# Patient Record
Sex: Female | Born: 1965
Health system: Southern US, Community
[De-identification: ages and names within clinical notes are randomized; demographics above are authoritative.]

## PROBLEM LIST (undated history)

## (undated) DIAGNOSIS — K649 Unspecified hemorrhoids: Secondary | ICD-10-CM

## (undated) HISTORY — DX: Unspecified hemorrhoids: K64.9

---

## 1998-06-27 ENCOUNTER — Inpatient Hospital Stay (HOSPITAL_COMMUNITY): Admission: AD | Admit: 1998-06-27 | Discharge: 1998-06-27 | Payer: Self-pay | Admitting: Obstetrics & Gynecology

## 2000-08-04 ENCOUNTER — Other Ambulatory Visit: Admission: RE | Admit: 2000-08-04 | Discharge: 2000-08-04 | Payer: Self-pay | Admitting: Obstetrics and Gynecology

## 2000-08-04 ENCOUNTER — Other Ambulatory Visit: Admission: RE | Admit: 2000-08-04 | Discharge: 2000-08-04 | Payer: Self-pay | Admitting: Gynecology

## 2001-03-15 ENCOUNTER — Inpatient Hospital Stay (HOSPITAL_COMMUNITY): Admission: AD | Admit: 2001-03-15 | Discharge: 2001-03-18 | Payer: Self-pay | Admitting: Obstetrics & Gynecology

## 2001-07-08 ENCOUNTER — Other Ambulatory Visit: Admission: RE | Admit: 2001-07-08 | Discharge: 2001-07-08 | Payer: Self-pay | Admitting: Obstetrics and Gynecology

## 2002-09-25 ENCOUNTER — Other Ambulatory Visit: Admission: RE | Admit: 2002-09-25 | Discharge: 2002-09-25 | Payer: Self-pay | Admitting: Obstetrics and Gynecology

## 2004-05-09 ENCOUNTER — Other Ambulatory Visit: Admission: RE | Admit: 2004-05-09 | Discharge: 2004-05-09 | Payer: Self-pay | Admitting: Obstetrics and Gynecology

## 2004-11-13 ENCOUNTER — Ambulatory Visit: Payer: Self-pay | Admitting: Family Medicine

## 2005-01-01 ENCOUNTER — Ambulatory Visit: Payer: Self-pay | Admitting: Family Medicine

## 2006-11-19 ENCOUNTER — Emergency Department (HOSPITAL_COMMUNITY): Admission: EM | Admit: 2006-11-19 | Discharge: 2006-11-20 | Payer: Self-pay | Admitting: Emergency Medicine

## 2007-02-16 ENCOUNTER — Ambulatory Visit: Payer: Self-pay | Admitting: Family Medicine

## 2007-02-17 ENCOUNTER — Ambulatory Visit: Payer: Self-pay | Admitting: Family Medicine

## 2007-02-17 LAB — CONVERTED CEMR LAB
ALT: 16 units/L (ref 0–40)
AST: 23 units/L (ref 0–37)
BUN: 8 mg/dL (ref 6–23)
Calcium: 9.3 mg/dL (ref 8.4–10.5)
Chloride: 105 meq/L (ref 96–112)
Creatinine, Ser: 0.8 mg/dL (ref 0.4–1.2)
Eosinophils Absolute: 0 10*3/uL (ref 0.0–0.6)
Eosinophils Relative: 0.1 % (ref 0.0–5.0)
GFR calc Af Amer: 103 mL/min
HCT: 37.2 % (ref 36.0–46.0)
Hemoglobin: 13 g/dL (ref 12.0–15.0)
Monocytes Absolute: 0 10*3/uL — ABNORMAL LOW (ref 0.2–0.7)
Monocytes Relative: 0.7 % — ABNORMAL LOW (ref 3.0–11.0)
Neutro Abs: 5.2 10*3/uL (ref 1.4–7.7)
Neutrophils Relative %: 87.1 % — ABNORMAL HIGH (ref 43.0–77.0)
RDW: 12.9 % (ref 11.5–14.6)
Total Bilirubin: 0.9 mg/dL (ref 0.3–1.2)
Total Protein: 6.6 g/dL (ref 6.0–8.3)
WBC: 5.9 10*3/uL (ref 4.5–10.5)

## 2007-03-10 ENCOUNTER — Ambulatory Visit: Payer: Self-pay | Admitting: Family Medicine

## 2007-03-10 LAB — CONVERTED CEMR LAB
BUN: 11 mg/dL (ref 6–23)
Basophils Absolute: 0.1 10*3/uL (ref 0.0–0.1)
Basophils Relative: 0.9 % (ref 0.0–1.0)
Creatinine, Ser: 0.8 mg/dL (ref 0.4–1.2)
Eosinophils Absolute: 0.1 10*3/uL (ref 0.0–0.6)
GFR calc Af Amer: 103 mL/min
GFR calc non Af Amer: 85 mL/min
HCT: 38.4 % (ref 36.0–46.0)
Hgb A1c MFr Bld: 4.3 % — ABNORMAL LOW (ref 4.6–6.0)
Monocytes Absolute: 0.5 10*3/uL (ref 0.2–0.7)
Monocytes Relative: 6.2 % (ref 3.0–11.0)
Platelets: 250 10*3/uL (ref 150–400)
RBC: 4.73 M/uL (ref 3.87–5.11)
RDW: 12.8 % (ref 11.5–14.6)

## 2007-05-07 ENCOUNTER — Encounter: Payer: Self-pay | Admitting: Family Medicine

## 2007-05-09 ENCOUNTER — Ambulatory Visit: Payer: Self-pay | Admitting: Family Medicine

## 2007-05-09 DIAGNOSIS — L259 Unspecified contact dermatitis, unspecified cause: Secondary | ICD-10-CM | POA: Insufficient documentation

## 2007-05-09 LAB — CONVERTED CEMR LAB
Basophils Absolute: 0.1 10*3/uL (ref 0.0–0.1)
CO2: 31 meq/L (ref 19–32)
Chloride: 106 meq/L (ref 96–112)
Creatinine, Ser: 0.7 mg/dL (ref 0.4–1.2)
Eosinophils Absolute: 0.1 10*3/uL (ref 0.0–0.6)
GFR calc Af Amer: 120 mL/min
LDL Cholesterol: 112 mg/dL — ABNORMAL HIGH (ref 0–99)
Lymphocytes Relative: 38.7 % (ref 12.0–46.0)
Monocytes Absolute: 0.3 10*3/uL (ref 0.2–0.7)
Monocytes Relative: 5.3 % (ref 3.0–11.0)
Neutro Abs: 3.2 10*3/uL (ref 1.4–7.7)
Potassium: 4.3 meq/L (ref 3.5–5.1)
Triglycerides: 78 mg/dL (ref 0–149)
VLDL: 16 mg/dL (ref 0–40)
WBC: 6 10*3/uL (ref 4.5–10.5)

## 2008-01-18 IMAGING — CT CT ANGIO CHEST
2 of 5 series · 18 of 36 positions shown · IV contrast (omnipaque)
Comparison: No prior CT. Two-view chest x-ray earlier in the morning is
correlated.

CLINICAL DATA: Left-sided chest pain and shoulder pain. Shortness of breath.

CT ANGIOGRAPHY OF CHEST ( PULMONARY EMBOLISM PROTOCOL)  11/20/2006:
TECHNIQUE: Multidetector CT imaging of the chest was performed according to the
protocol for detection of pulmonary embolism during bolus injection of
intravenous contrast.  Coronal and sagittal plane CT angiographic image
reconstructions were also generated.
Contrast:  80 cc Omnipaque 300

[Series 5: pe 1.0 b20f st · axial · 0.53mm/px · z∈[-255,-33]mm · 15 of 254 slices shown]
[im 16/254  lung]
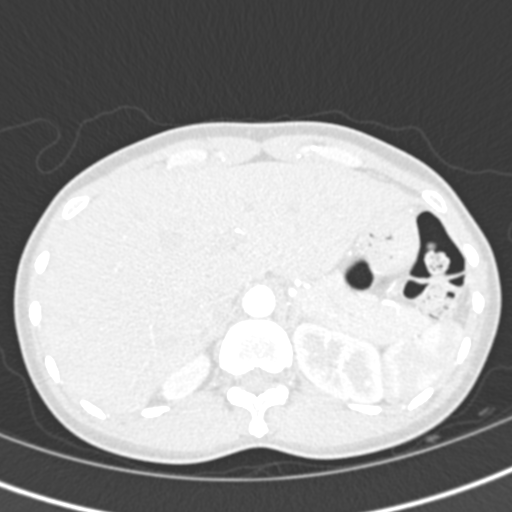
[im 32/254  mediastinal]
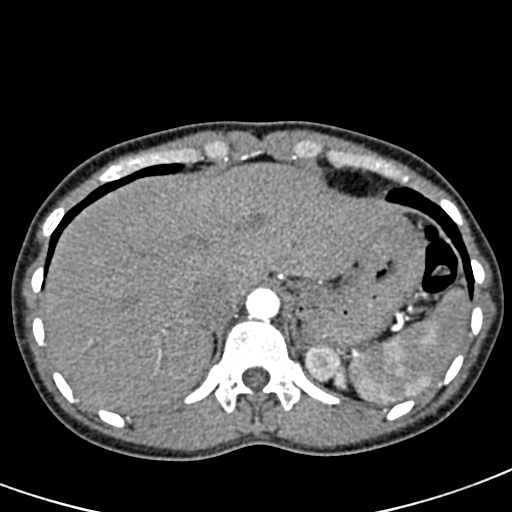
[im 48/254  lung]
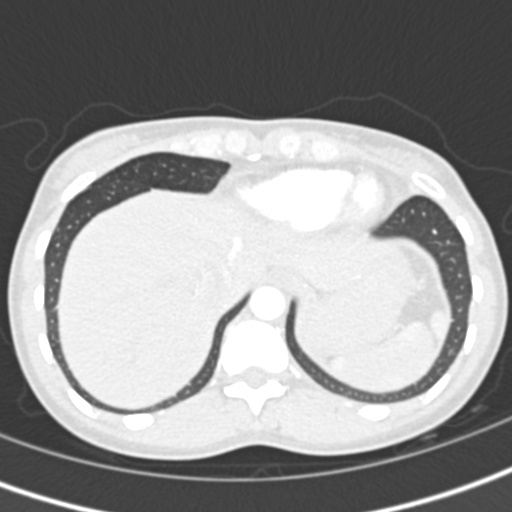
[im 64/254  mediastinal]
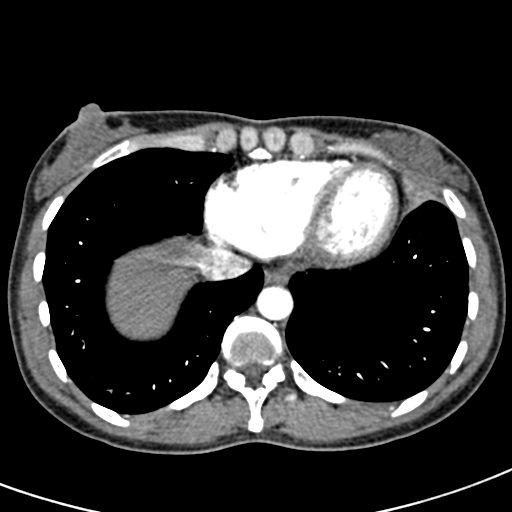
[im 80/254  lung]
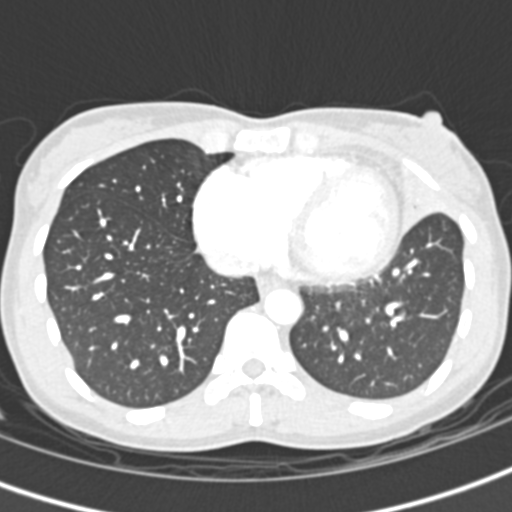
[im 95/254  mediastinal]
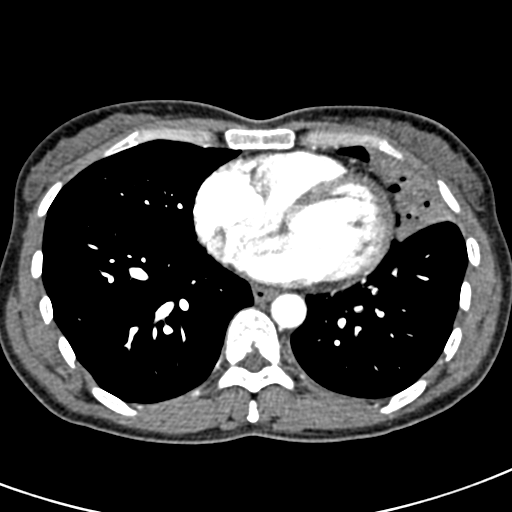
[im 111/254  lung]
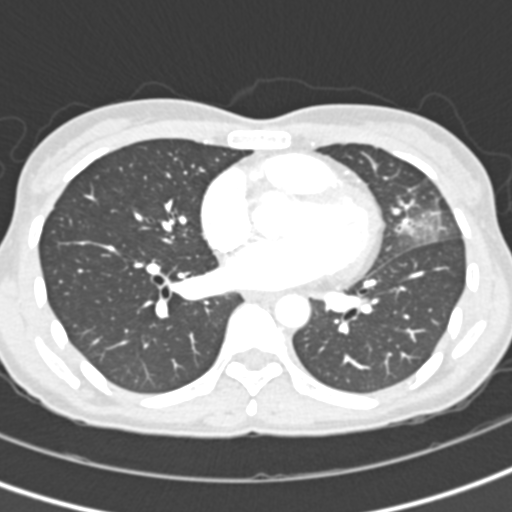
[im 127/254  mediastinal]
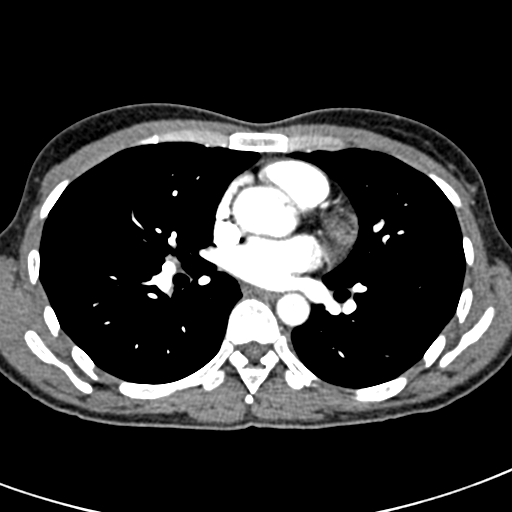
[im 143/254  lung]
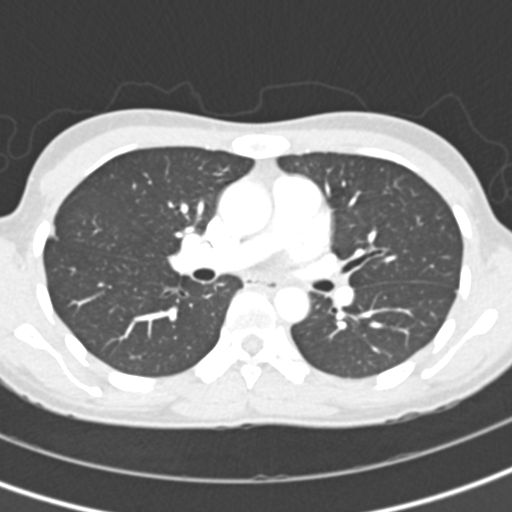
[im 159/254  mediastinal]
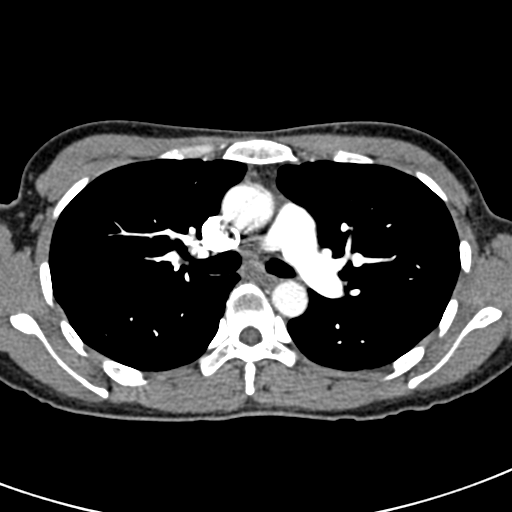
[im 174/254  lung]
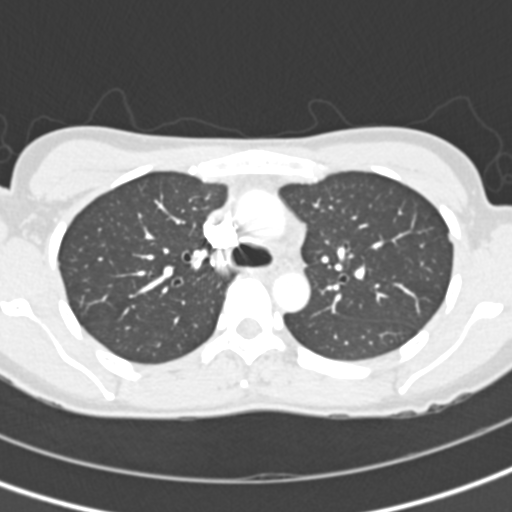
[im 190/254  mediastinal]
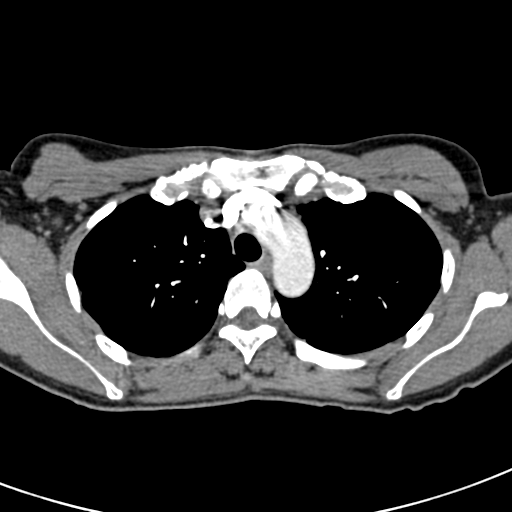
[im 206/254  lung]
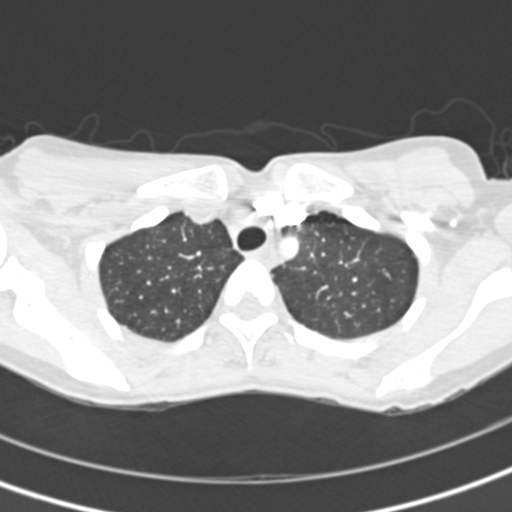
[im 222/254  mediastinal]
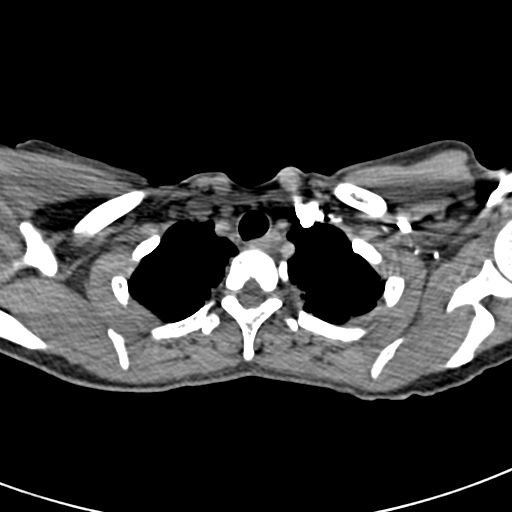
[im 238/254  lung]
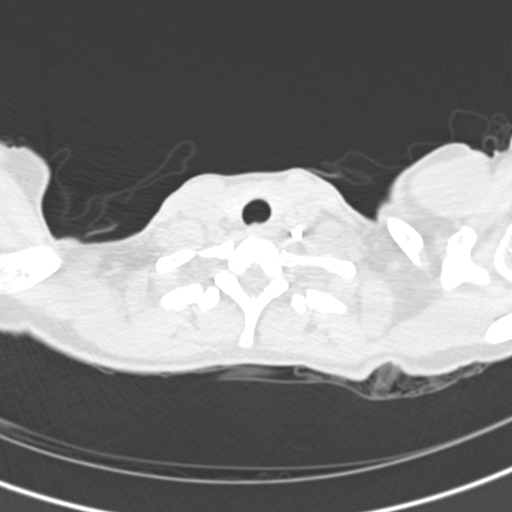

[Series 602: coronal chest · coronal · 0.53mm/px · 3 of 44 slices shown]
[im 9/44  mediastinal]
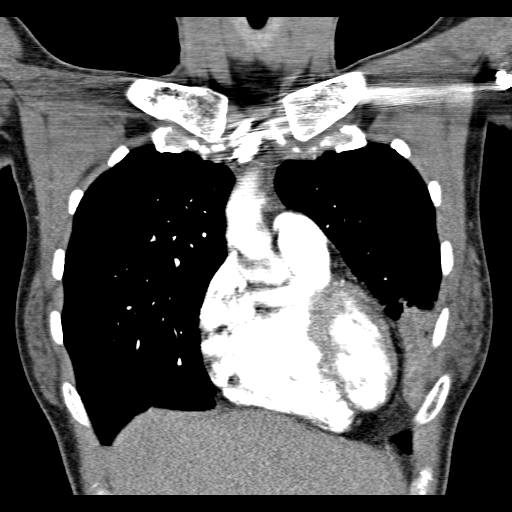
[im 18/44  mediastinal]
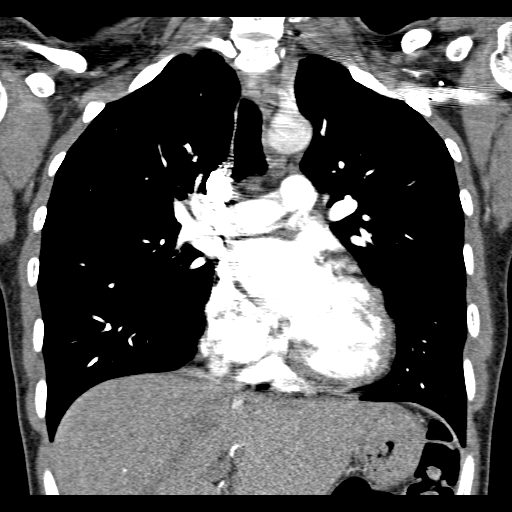
[im 26/44  mediastinal]
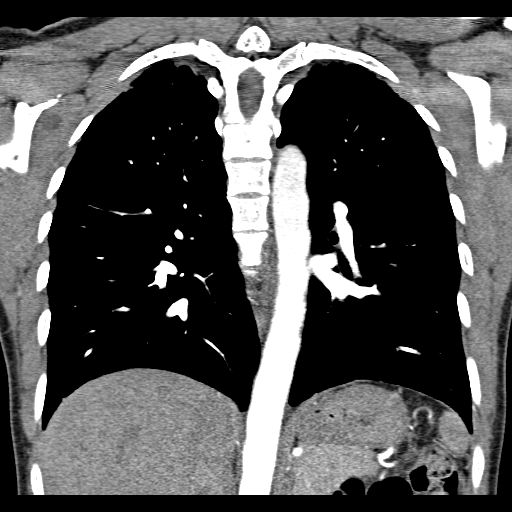

[18 of 36 positions shown; findings below may reference images not displayed]

FINDINGS: Contrast opacification of a pulmonary arteries is excellent, yielding
an excellent diagnostic quality study. No filling defects are identified within
either main pulmonary artery or their branches in either lung to suggest
pulmonary embolism. The heart size is upper normal. There is no evidence of a
pericardial effusion. There are no pleural effusions. The thoracic and upper
abdominal aorta are normal in appearance. There is no significant
lymphadenopathy.

Airspace consolidation with air bronchograms is present in the lingula. Mild
biapical pleural and parenchymal scarring are present. The lungs are otherwise
clear. There is mucus within the left mainstem bronchus at its bifurcation.

The visualized upper abdomen is unremarkable for the early arterial phase of
enhancement.
IMPRESSION: 1. No evidence of pulmonary embolism.
2. Acute pneumonia in the lingula.

## 2008-01-18 IMAGING — CR DG CHEST 2V
2 series · 2 of 2 positions shown · non-contrast
Comparison: None.

CLINICAL DATA: Cough. Left-sided chest pain.

CHEST - 2 VIEW  11/20/2006:

[w chest pa]
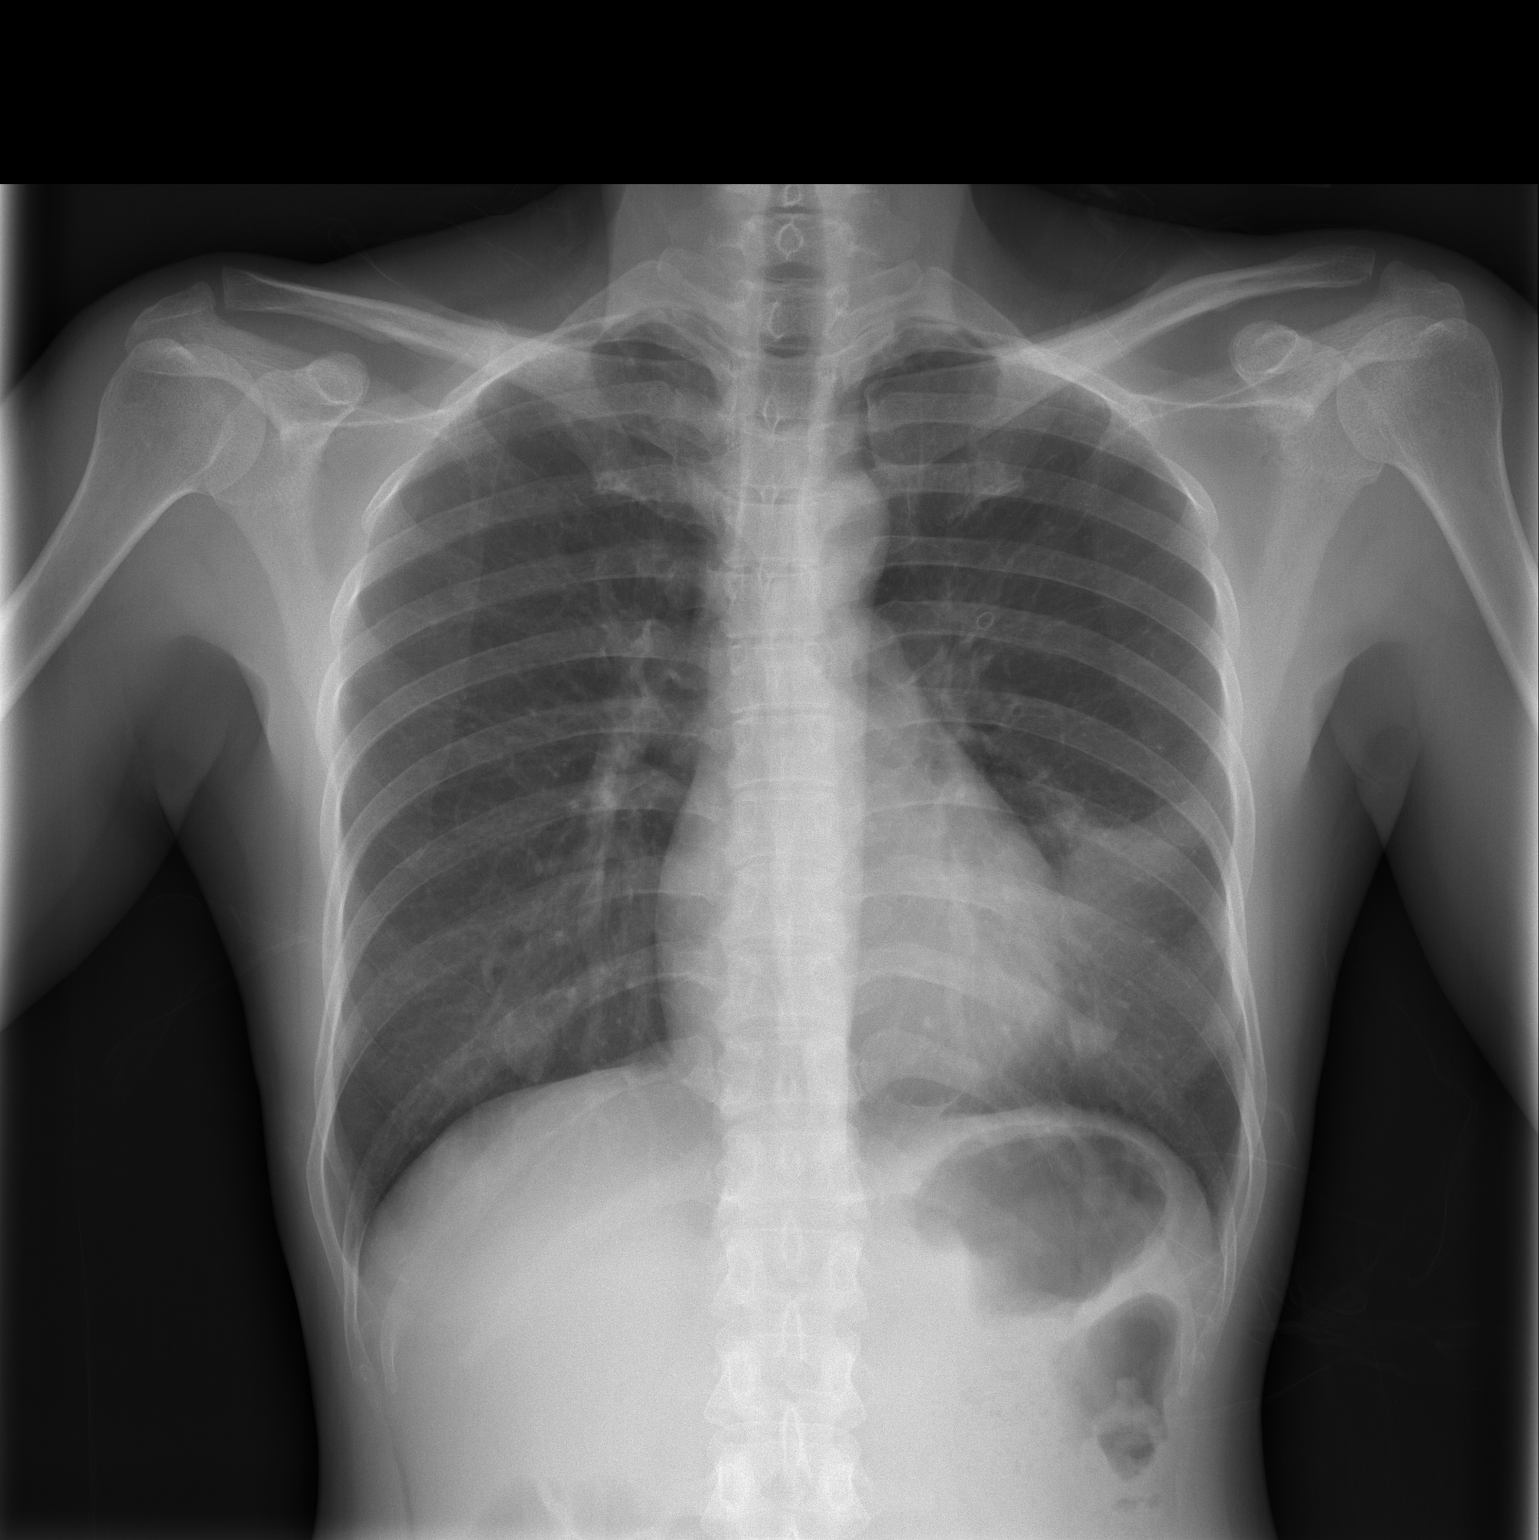

[w chest lat]
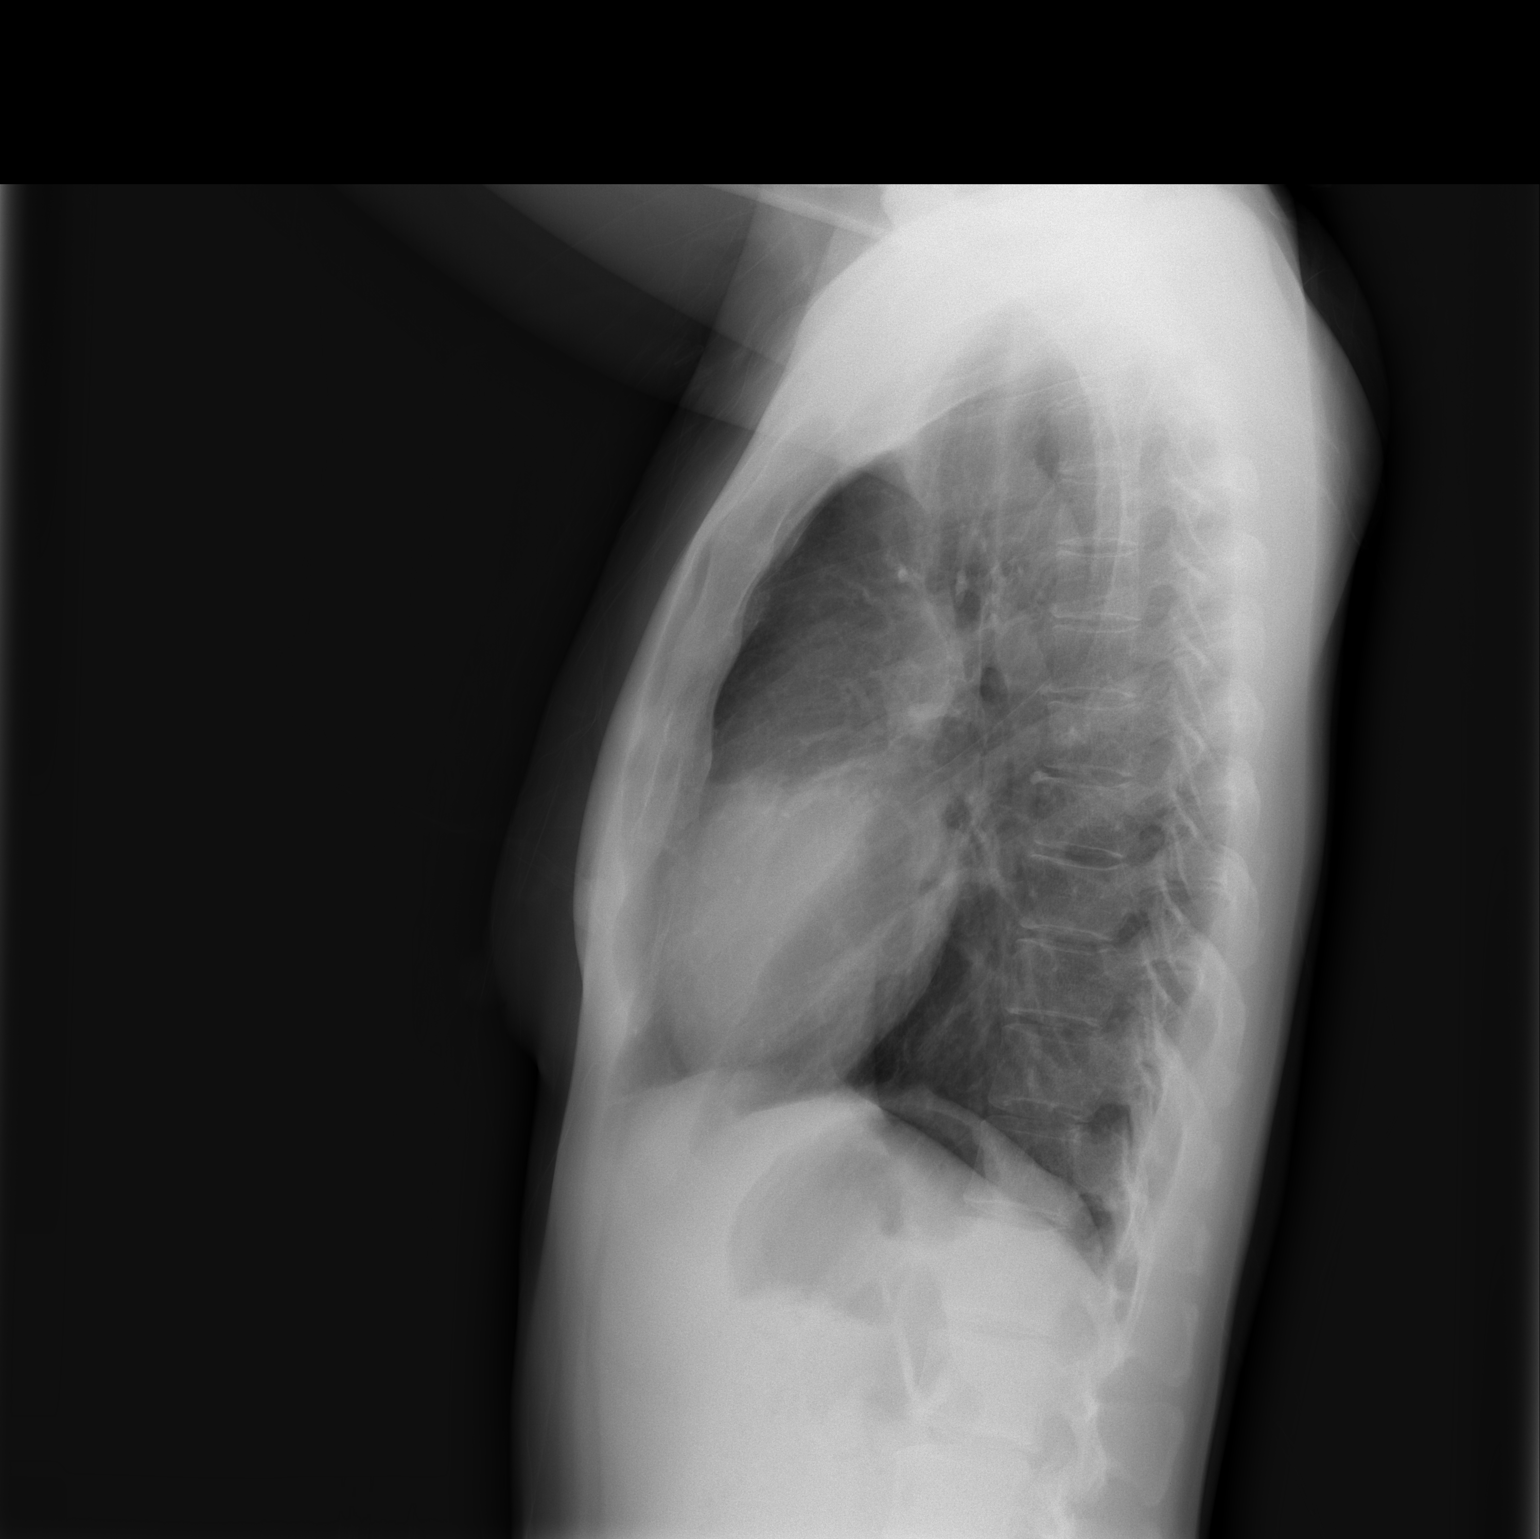

[2 of 2 positions shown; findings below may reference images not displayed]

FINDINGS: There is consolidation in the lingula. The lungs are otherwise clear.
There are no pleural effusions. The cardiomediastinal silhouette is
unremarkable. The visualized bony thorax appears intact. Apparent nodular
opacities projected over the lung bases on the frontal view are consistent with
the nipples.
IMPRESSION: Lingular pneumonia. I would recommend followup chest x-ray after treatment to
confirm clearing of this opacity.

## 2008-03-29 ENCOUNTER — Telehealth (INDEPENDENT_AMBULATORY_CARE_PROVIDER_SITE_OTHER): Payer: Self-pay | Admitting: *Deleted

## 2008-03-30 ENCOUNTER — Ambulatory Visit: Payer: Self-pay | Admitting: Family Medicine

## 2008-03-30 DIAGNOSIS — N390 Urinary tract infection, site not specified: Secondary | ICD-10-CM | POA: Insufficient documentation

## 2008-03-30 LAB — CONVERTED CEMR LAB
Bilirubin Urine: NEGATIVE
Glucose, Urine, Semiquant: NEGATIVE
Ketones, urine, test strip: NEGATIVE
Nitrite: NEGATIVE
Urobilinogen, UA: NEGATIVE
pH: 5

## 2008-03-31 ENCOUNTER — Encounter: Payer: Self-pay | Admitting: Family Medicine

## 2008-04-03 LAB — CONVERTED CEMR LAB
Albumin: 3.9 g/dL (ref 3.5–5.2)
Alkaline Phosphatase: 38 units/L — ABNORMAL LOW (ref 39–117)
BUN: 15 mg/dL (ref 6–23)
Basophils Relative: 0.1 % (ref 0.0–1.0)
Bilirubin, Direct: 0.1 mg/dL (ref 0.0–0.3)
CO2: 31 meq/L (ref 19–32)
Eosinophils Absolute: 0.1 10*3/uL (ref 0.0–0.7)
GFR calc Af Amer: 119 mL/min
Monocytes Absolute: 0.4 10*3/uL (ref 0.1–1.0)
Monocytes Relative: 4.6 % (ref 3.0–12.0)
Neutrophils Relative %: 69.4 % (ref 43.0–77.0)
Potassium: 5 meq/L (ref 3.5–5.1)
RBC: 5.01 M/uL (ref 3.87–5.11)
RDW: 12 % (ref 11.5–14.6)
Total Protein: 8 g/dL (ref 6.0–8.3)

## 2008-04-04 ENCOUNTER — Encounter (INDEPENDENT_AMBULATORY_CARE_PROVIDER_SITE_OTHER): Payer: Self-pay | Admitting: *Deleted

## 2008-04-05 ENCOUNTER — Telehealth (INDEPENDENT_AMBULATORY_CARE_PROVIDER_SITE_OTHER): Payer: Self-pay | Admitting: *Deleted

## 2008-04-20 ENCOUNTER — Encounter (INDEPENDENT_AMBULATORY_CARE_PROVIDER_SITE_OTHER): Payer: Self-pay | Admitting: *Deleted

## 2008-04-20 LAB — CONVERTED CEMR LAB
Bilirubin Urine: NEGATIVE
Protein, U semiquant: NEGATIVE
Urobilinogen, UA: 0.2

## 2008-04-21 ENCOUNTER — Encounter: Payer: Self-pay | Admitting: Family Medicine

## 2008-04-23 ENCOUNTER — Encounter (INDEPENDENT_AMBULATORY_CARE_PROVIDER_SITE_OTHER): Payer: Self-pay | Admitting: *Deleted

## 2008-06-27 ENCOUNTER — Encounter: Payer: Self-pay | Admitting: Family Medicine

## 2008-06-27 ENCOUNTER — Other Ambulatory Visit: Admission: RE | Admit: 2008-06-27 | Discharge: 2008-06-27 | Payer: Self-pay | Admitting: Family Medicine

## 2008-06-27 ENCOUNTER — Ambulatory Visit: Payer: Self-pay | Admitting: Family Medicine

## 2008-07-03 ENCOUNTER — Encounter (INDEPENDENT_AMBULATORY_CARE_PROVIDER_SITE_OTHER): Payer: Self-pay | Admitting: *Deleted

## 2008-07-08 ENCOUNTER — Encounter (INDEPENDENT_AMBULATORY_CARE_PROVIDER_SITE_OTHER): Payer: Self-pay | Admitting: *Deleted

## 2008-07-08 LAB — CONVERTED CEMR LAB
ALT: 13 units/L (ref 0–35)
Albumin: 3.7 g/dL (ref 3.5–5.2)
Basophils Absolute: 0.1 10*3/uL (ref 0.0–0.1)
Basophils Relative: 1.1 % (ref 0.0–3.0)
Bilirubin, Direct: 0.1 mg/dL (ref 0.0–0.3)
CO2: 29 meq/L (ref 19–32)
Calcium: 9.1 mg/dL (ref 8.4–10.5)
Eosinophils Absolute: 0 10*3/uL (ref 0.0–0.7)
Eosinophils Relative: 0.7 % (ref 0.0–5.0)
HDL: 65.2 mg/dL (ref >39.0)
Hemoglobin: 13 g/dL (ref 12.0–15.0)
LDL Cholesterol: 91 mg/dL (ref 0–99)
MCHC: 34.5 g/dL (ref 30.0–36.0)
Monocytes Relative: 5.5 % (ref 3.0–12.0)
TSH: 1.4 microintl units/mL (ref 0.35–5.50)
Total Bilirubin: 0.9 mg/dL (ref 0.3–1.2)
Triglycerides: 79 mg/dL (ref 0–149)
WBC: 5.6 10*3/uL (ref 4.5–10.5)

## 2008-11-06 ENCOUNTER — Ambulatory Visit: Payer: Self-pay | Admitting: Family Medicine

## 2008-11-06 ENCOUNTER — Telehealth: Payer: Self-pay | Admitting: Family Medicine

## 2008-11-06 DIAGNOSIS — F411 Generalized anxiety disorder: Secondary | ICD-10-CM | POA: Insufficient documentation

## 2008-11-06 DIAGNOSIS — R079 Chest pain, unspecified: Secondary | ICD-10-CM | POA: Insufficient documentation

## 2008-11-07 ENCOUNTER — Encounter (INDEPENDENT_AMBULATORY_CARE_PROVIDER_SITE_OTHER): Payer: Self-pay | Admitting: *Deleted

## 2009-07-23 ENCOUNTER — Ambulatory Visit: Payer: Self-pay | Admitting: Family Medicine

## 2009-07-25 ENCOUNTER — Encounter (INDEPENDENT_AMBULATORY_CARE_PROVIDER_SITE_OTHER): Payer: Self-pay | Admitting: *Deleted

## 2010-07-31 ENCOUNTER — Ambulatory Visit: Payer: Self-pay | Admitting: Family Medicine

## 2010-07-31 DIAGNOSIS — M542 Cervicalgia: Secondary | ICD-10-CM | POA: Insufficient documentation

## 2010-08-01 ENCOUNTER — Ambulatory Visit: Payer: Self-pay | Admitting: Family Medicine

## 2010-08-01 LAB — CONVERTED CEMR LAB
Bilirubin Urine: NEGATIVE
Glucose, Urine, Semiquant: NEGATIVE
Protein, U semiquant: NEGATIVE
Urobilinogen, UA: 0.2
WBC Urine, dipstick: NEGATIVE

## 2010-08-04 LAB — CONVERTED CEMR LAB
ALT: 13 units/L (ref 0–35)
AST: 19 units/L (ref 0–37)
Alkaline Phosphatase: 46 units/L (ref 39–117)
BUN: 15 mg/dL (ref 6–23)
Basophils Absolute: 0.1 10*3/uL (ref 0.0–0.1)
CO2: 28 meq/L (ref 19–32)
Cholesterol: 173 mg/dL (ref 0–200)
Eosinophils Absolute: 0.1 10*3/uL (ref 0.0–0.7)
Eosinophils Relative: 1.2 % (ref 0.0–5.0)
Glucose, Bld: 82 mg/dL (ref 70–99)
HCT: 38.5 % (ref 36.0–46.0)
HDL: 58.1 mg/dL (ref >39.00)
Hemoglobin: 12.8 g/dL (ref 12.0–15.0)
Lymphs Abs: 1.8 10*3/uL (ref 0.7–4.0)
Monocytes Absolute: 0.4 10*3/uL (ref 0.1–1.0)
Neutro Abs: 3.4 10*3/uL (ref 1.4–7.7)
Neutrophils Relative %: 59.8 % (ref 43.0–77.0)
RBC: 4.54 M/uL (ref 3.87–5.11)
RDW: 13.3 % (ref 11.5–14.6)
Sodium: 141 meq/L (ref 135–145)
Triglycerides: 23 mg/dL (ref 0.0–149.0)
VLDL: 4.6 mg/dL (ref 0.0–40.0)
WBC: 5.7 10*3/uL (ref 4.5–10.5)

## 2010-08-05 ENCOUNTER — Ambulatory Visit: Payer: Self-pay | Admitting: Family Medicine

## 2010-12-28 ENCOUNTER — Encounter: Payer: Self-pay | Admitting: Family Medicine

## 2010-12-30 ENCOUNTER — Ambulatory Visit
Admission: RE | Admit: 2010-12-30 | Discharge: 2010-12-30 | Payer: Self-pay | Source: Home / Self Care | Attending: Family Medicine | Admitting: Family Medicine

## 2011-01-04 LAB — CONVERTED CEMR LAB
Alkaline Phosphatase: 41 units/L (ref 39–117)
BUN: 13 mg/dL (ref 6–23)
BUN: 9 mg/dL (ref 6–23)
Basophils Relative: 0 % (ref 0.0–3.0)
Basophils Relative: 1 % (ref 0.0–3.0)
Bilirubin Urine: NEGATIVE
Bilirubin, Direct: 0.1 mg/dL (ref 0.0–0.3)
CO2: 32 meq/L (ref 19–32)
Calcium: 9.1 mg/dL (ref 8.4–10.5)
Calcium: 9.4 mg/dL (ref 8.4–10.5)
Chloride: 107 meq/L (ref 96–112)
Creatinine, Ser: 0.7 mg/dL (ref 0.4–1.2)
Creatinine, Ser: 0.7 mg/dL (ref 0.4–1.2)
Eosinophils Absolute: 0 10*3/uL (ref 0.0–0.7)
Eosinophils Absolute: 0.1 10*3/uL (ref 0.0–0.7)
Eosinophils Relative: 0.4 % (ref 0.0–5.0)
Glucose, Bld: 92 mg/dL (ref 70–99)
Glucose, Urine, Semiquant: NEGATIVE
HCT: 40.8 % (ref 36.0–46.0)
Hemoglobin: 13.6 g/dL (ref 12.0–15.0)
Hemoglobin: 14 g/dL (ref 12.0–15.0)
Ketones, urine, test strip: NEGATIVE
Ketones, urine, test strip: NEGATIVE
Lymphocytes Relative: 29 % (ref 12.0–46.0)
Lymphs Abs: 2.2 10*3/uL (ref 0.7–4.0)
MCV: 82.9 fL (ref 78.0–100.0)
Monocytes Absolute: 0.3 10*3/uL (ref 0.1–1.0)
Monocytes Relative: 5 % (ref 3.0–12.0)
Neutrophils Relative %: 64.6 % (ref 43.0–77.0)
Nitrite: NEGATIVE
Nitrite: NEGATIVE
Platelets: 254 10*3/uL (ref 150–400)
Potassium: 4.3 meq/L (ref 3.5–5.1)
Protein, U semiquant: NEGATIVE
RBC: 4.81 M/uL (ref 3.87–5.11)
Sodium: 143 meq/L (ref 135–145)
TSH: 0.92 microintl units/mL (ref 0.35–5.50)
Total Bilirubin: 1.1 mg/dL (ref 0.3–1.2)
Total Protein: 7.5 g/dL (ref 6.0–8.3)
Urobilinogen, UA: NEGATIVE
Urobilinogen, UA: NEGATIVE
WBC Urine, dipstick: NEGATIVE
WBC: 6.7 10*3/uL (ref 4.5–10.5)

## 2011-01-08 NOTE — Assessment & Plan Note (Signed)
Summary: cough for week, otc not working///sph   Vital Signs:  Patient profile:   45 year old female Menstrual status:  regular Weight:      110.0 pounds Temp:     98.3 degrees F oral BP sitting:   112 / 64  (right arm) Cuff size:   regular  Vitals Entered By: Almeta Monas CMA Duncan Dull) (December 30, 2010 9:48 AM) CC: x8days c/o congestion, cough, sinus pressure/headache, Cough   History of Present Illness:  Cough      This is a 45 year old woman who presents with Cough.  The symptoms began 1 week ago.  Pt c/o symptoms for almost 1 week.  Pt has some upper resp congestion but is mostly c/o cough.  Nyquil helps the cough at night.  The patient reports productive cough and wheezing, but denies non-productive cough, pleuritic chest pain, shortness of breath, exertional dyspnea, fever, hemoptysis, and malaise.  Associated symtpoms include cold/URI symptoms and nasal congestion.  The patient denies the following symptoms: sore throat, chronic rhinitis, weight loss, acid reflux symptoms, and peripheral edema.  The cough is worse with activity.    Current Medications (verified): 1)  Yaz 3-0.02 Mg Tabs (Drospirenone-Ethinyl Estradiol) .... As Directed 2)  Mucinex 600 Mg Xr12h-Tab (Guaifenesin) .... As Directed 3)  Vicks Dayquil Cold & Flu 10-5-325 Mg/32ml Liqd (Dm-Phenylephrine-Acetaminophen) .... As Directed 4)  Biaxin Xl 500 Mg Xr24h-Tab (Clarithromycin) .... 2 By Mouth Once Daily 5)  Flonase 50 Mcg/act Susp (Fluticasone Propionate) .... 2 Sprays Each Nostril Once Daily  Allergies (verified): No Known Drug Allergies  Past History:  Past Medical History: Last updated: 05/09/2007 Unremarkable  Past Surgical History: Last updated: 05/06/2007 P1G1  Family History: Last updated: 05/09/2007 Family History Liver disease- Brother  Social History: Last updated: 07/31/2010 Never Smoked Alcohol use-no Drug use-no Regular exercise-no Divorced Occupation:  Restaurant-- [employer]  Risk Factors: Alcohol Use: 0 (07/31/2010) Caffeine Use: no (07/31/2010) Exercise: yes (07/31/2010)  Risk Factors: Smoking Status: never (07/31/2010) Passive Smoke Exposure: no (07/31/2010)  Family History: Reviewed history from 05/09/2007 and no changes required. Family History Liver disease- Brother  Social History: Reviewed history from 07/31/2010 and no changes required. 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  Otoscopic examination reveals clear canals, tympanic membranes are intact bilaterally without bulging, retraction, inflammation or discharge. Hearing is grossly normal bilaterally. Nose:  external erythema and mucosal erythema.   Mouth:  Oral mucosa and oropharynx without lesions or exudates.  Teeth in good repair. Neck:  No deformities, masses, or tenderness noted. Lungs:  + rhonci on right normal respiratory effort and no intercostal retractions.   Psych:  Oriented X3 and normally interactive.     Impression & Recommendations:  Problem # 1:  BRONCHITIS- ACUTE (ICD-466.0)  Her updated medication list for this problem includes:    Mucinex 600 Mg Xr12h-tab (Guaifenesin) .Marland Kitchen... As directed    Vicks Dayquil Cold & Flu 10-5-325 Mg/69ml Liqd (Dm-phenylephrine-acetaminophen) .Marland Kitchen... As directed    Biaxin Xl 500 Mg Xr24h-tab (Clarithromycin) .Marland Kitchen... 2 by mouth once daily  Take antibiotics and other medications as directed. Encouraged to push clear liquids, get enough rest, and take acetaminophen as needed. To be seen in 5-7 days if no improvement, sooner if worse.  Complete Medication List: 1)  Yaz 3-0.02 Mg Tabs (Drospirenone-ethinyl estradiol) .... As  directed 2)  Mucinex 600 Mg Xr12h-tab (Guaifenesin) .... As directed 3)  Vicks Dayquil Cold & Flu 10-5-325 Mg/2ml Liqd (Dm-phenylephrine-acetaminophen) .... As directed 4)  Biaxin Xl 500 Mg Xr24h-tab (Clarithromycin) .... 2 by mouth once daily 5)  Flonase 50 Mcg/act Susp (Fluticasone propionate) .... 2 sprays each nostril once daily  Patient Instructions: 1)  Acute Bronchitis symptoms for less then 10 days are not  helped by antibiotics. Take over the counter cough medications. Call if no improvement in 5-7 days, sooner if increasing cough, fever, or new symptoms ( shortness of breath, chest pain) .  Prescriptions: FLONASE 50 MCG/ACT SUSP (FLUTICASONE PROPIONATE) 2 sprays each nostril once daily  #1 x 1   Entered and Authorized by:   Loreen Freud DO   Signed by:   Loreen Freud DO on 12/30/2010   Method used:   Electronically to        [id number]* (retail)       [address]       [place], [place]  [address]       Ph: [phone]       Fax: [phone]   RxID:   [id number] BIAXIN XL 500 MG XR24H-TAB (CLARITHROMYCIN) 2 by mouth once daily  #14 x 0   Entered and Authorized by:   Loreen Freud DO   Signed by:   Loreen Freud DO on 12/30/2010   Method used:   Electronically to        [facility] #[id number]* (retail)       [address]       [place], [place]  [address]       Ph: [phone]       Fax: [phone]   RxID:   [id number]    Orders Added: 1)  Est. Patient Level III [[address]]

## 2011-01-08 NOTE — Assessment & Plan Note (Signed)
Summary: cpx/fasting//kn   Vital Signs:  Patient profile:   45 year old female Menstrual status:  regular LMP:     07/30/2010 Height:      59 inches Weight:      106.8 pounds BMI:     21.65 Temp:     98.6 degrees F oral Pulse rate:   56 / minute Pulse rhythm:   regular BP sitting:   106 / 60  (left arm)  Vitals Entered By: Almeta Monas CMA Duncan Dull) (July 31, 2010 9:15 AM) CC: CPX/FASTING. No pap today due to Menstrual cycle, Mammogram due. Pt request to start Birth control. LMP (date): 07/30/2010     Menstrual Status regular Enter LMP: 07/30/2010   History of Present Illness: Pt here for cpe.  She is on her menstrual cycle so we will r/s pap. Pt c/o R sided chest pain --same as previously.  Pt still c/o same back and neck pain as previously.      Preventive Screening-Counseling & Management  Alcohol-Tobacco     Alcohol drinks/day: 0     Smoking Status: never     Passive Smoke Exposure: no  Caffeine-Diet-Exercise     Caffeine use/day: no     Does Patient Exercise: yes     Type of exercise: walking     Times/week: <3  Hep-HIV-STD-Contraception     Dental Visit-last 6 months yes     Dental Care Counseling: not indicated; dental care within six months     SBE monthly: no     SBE Education/Counseling: to perform regular SBE  Safety-Violence-Falls     Seat Belt Use: yes  Current Medications (verified): 1)  Cipro Hc 0.2-1 % Susp (Ciprofloxacin-Hydrocortisone) .... 3 Gtt in R Ear Two Times A Day For 7 Days  Allergies (verified): No Known Drug Allergies  Past History:  Past Medical History: Last updated: 05/09/2007 Unremarkable  Past Surgical History: Last updated: 05/06/2007 P1G1  Family History: Last updated: 05/09/2007 Family History Liver disease- Brother  Social History: Last updated: 07/31/2010 Never Smoked Alcohol use-no Drug use-no Regular exercise-no Divorced Occupation:  Restaurant-- [employer]  Risk Factors: Alcohol Use: 0  (07/31/2010) Caffeine Use: no (07/31/2010) Exercise: yes (07/31/2010)  Risk Factors: Smoking Status: never (07/31/2010) Passive Smoke Exposure: no (07/31/2010)  Family History: Reviewed history from 05/09/2007 and no changes required. Family History Liver disease- Brother  Social History: Reviewed history from 05/09/2007 and no changes required. 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 Eyes:  Denies blurring, discharge, double vision, eye irritation, eye pain, halos, itching, light sensitivity, red eye, vision loss-1 eye, and vision loss-both eyes; optho--due. ENT:  Denies decreased hearing, difficulty swallowing, ear discharge, earache, hoarseness, nasal congestion, nosebleeds, postnasal drainage, ringing in ears, sinus pressure, and sore throat. CV:  Complains of chest pain or discomfort; denies bluish discoloration of lips or nails, difficulty breathing at night, difficulty breathing while lying down, fainting, fatigue, leg cramps with exertion, lightheadness, near fainting, palpitations, shortness of breath with exertion, swelling of feet, swelling of hands, and weight gain. Resp:  Denies chest discomfort, chest pain with inspiration, cough, coughing up blood, excessive snoring, hypersomnolence, morning headaches, pleuritic, shortness of breath, sputum productive, and wheezing. GI:  Denies abdominal pain, bloody stools, change in bowel habits, constipation, dark tarry stools, diarrhea, excessive  appetite, gas, hemorrhoids, indigestion, loss of appetite, and nausea. GU:  Denies abnormal vaginal bleeding, decreased libido, discharge, dysuria, genital sores, hematuria, incontinence, nocturia, urinary frequency, and urinary hesitancy. MS:  Complains  of joint pain and mid back pain; R sided back and neck pain. Derm:  Denies changes in color of skin, changes in nail beds, dryness, excessive perspiration, flushing, hair loss, insect bite(s), itching, lesion(s), poor wound healing, and rash. Neuro:  Denies brief paralysis, difficulty with concentration, disturbances in coordination, falling down, headaches, inability to speak, memory loss, numbness, poor balance, seizures, sensation of room spinning, tingling, tremors, visual disturbances, and weakness. Psych:  Denies alternate hallucination ( auditory/visual), anxiety, depression, easily angered, easily tearful, irritability, mental problems, panic attacks, sense of great danger, suicidal thoughts/plans, thoughts of violence, unusual visions or sounds, and thoughts /plans of harming others. Endo:  Denies cold intolerance, excessive hunger, excessive thirst, excessive urination, heat intolerance, polyuria, and weight change. Heme:  Denies abnormal bruising, bleeding, enlarge lymph nodes, fevers, pallor, and skin discoloration. Allergy:  Denies hives or rash, itching eyes, persistent infections, seasonal allergies, and sneezing.  Physical Exam  General:  Well-developed,well-nourished,in no acute distress; alert,appropriate and cooperative throughout examination Head:  Normocephalic and atraumatic without obvious abnormalities. No apparent alopecia or balding. Eyes:  No corneal or conjunctival inflammation noted. EOMI. Perrla. Funduscopic exam benign, without hemorrhages, exudates or papilledema. Vision grossly normal. Ears:  R ear -- canal errythematous and tm dull Nose:  External nasal examination shows no deformity or inflammation. Nasal mucosa are pink and moist without lesions or exudates. Mouth:  Oral mucosa and oropharynx without lesions or exudates.  Teeth in good repair. Neck:  No deformities, masses, or tenderness noted. Chest Wall:  No deformities, masses, or tenderness noted. Breasts:   No mass, nodules, thickening, tenderness, bulging, retraction, inflamation, nipple discharge or skin changes noted.   Lungs:  Normal respiratory effort, chest expands symmetrically. Lungs are clear to auscultation, no crackles or wheezes. Heart:  normal rate and no murmur.   Abdomen:  Bowel sounds positive,abdomen soft and non-tender without masses, organomegaly or hernias noted. Rectal:  deferred Genitalia:  deferred Msk:  normal ROM, no joint tenderness, no joint swelling, no joint warmth, no redness over joints, no joint deformities, no joint instability, and no crepitation.   Pulses:  R posterior tibial normal, R dorsalis pedis normal, R carotid normal, L posterior tibial normal, L dorsalis pedis normal, and L carotid normal.   Extremities:  No clubbing, cyanosis, edema, or deformity noted with normal full range of motion of all joints.   Neurologic:  No cranial nerve deficits noted. Station and gait are normal. Plantar reflexes are down-going bilaterally. DTRs are symmetrical throughout. Sensory, motor and coordinative functions appear intact. Skin:  Intact without suspicious lesions or rashes Cervical Nodes:  No lymphadenopathy noted Axillary Nodes:  No palpable lymphadenopathy Psych:  Cognition and judgment appear intact. Alert and cooperative with normal attention span and concentration. No apparent delusions, illusions, hallucinations   Impression & Recommendations:  Problem # 1:  PREVENTIVE HEALTH CARE (ICD-V70.0) ghm utd pt left office without labs Orders: Venipuncture (21308) TLB-Lipid Panel (80061-LIPID) TLB-BMP (Basic Metabolic Panel-BMET) (80048-METABOL) TLB-CBC Platelet - w/Differential (85025-CBCD) TLB-Hepatic/Liver Function Pnl (80076-HEPATIC) TLB-TSH (Thyroid Stimulating Hormone) (65784-ONG) Radiology Referral (Radiology) EKG w/ Interpretation (93000)  Problem # 2:  NECK PAIN, RIGHT (ICD-723.1)  Orders: Chiropractic Referral (Chiro) EKG w/ Interpretation  (93000)  Discussed exercises and use of moist heat or cold and medication.   Problem # 3:  ANXIETY STATE,  UNSPECIFIED (ICD-300.00)  The following medications were removed from the medication list:    Zoloft 50 Mg Tabs (Sertraline hcl) .Marland Kitchen... 1/2 by mouth once daily for 1 week then 1 by mouth once daily  Discussed medication use and relaxation techniques.   Orders: EKG w/ Interpretation (93000)  Problem # 4:  CHEST PAIN UNSPECIFIED (ICD-786.50)  Orders: Chiropractic Referral (Chiro) EKG w/ Interpretation (93000)  Complete Medication List: 1)  Cipro Hc 0.2-1 % Susp (Ciprofloxacin-hydrocortisone) .... 3 gtt in r ear two times a day for 7 days Prescriptions: CIPRO HC 0.2-1 % SUSP (CIPROFLOXACIN-HYDROCORTISONE) 3 gtt in R ear two times a day for 7 days  #7 days x 0   Entered and Authorized by:   Loreen Freud DO   Signed by:   Loreen Freud DO on 07/31/2010   Method used:   Electronically to        [facility] #[id number]* (retail)       [address]       [place], [place]  [address]       Ph: [phone]       Fax: [phone]   RxID:   [id number]    EKG  Procedure date:  07/31/2010  Findings:      Sinus bradycardia with rate of:  52

## 2011-01-08 NOTE — Assessment & Plan Note (Signed)
Summary: per dr Laury Axon - send to lab/cbs   History of Present Illness: Pt here for lab only  Allergies: No Known Drug Allergies   Complete Medication List: 1)  Cipro Hc 0.2-1 % Susp (Ciprofloxacin-hydrocortisone) .... 3 gtt in r ear two times a day for 7 days

## 2011-01-08 NOTE — Letter (Signed)
Summary: [facility]: Immunoassay Fecal Occult Blood (iFOB) Order Form  [facility] [place]  [address] [place], [place] [address]   Phone: [phone]  Fax: [phone]       Lab: Immunoassay Fecal Occult Blood (iFOB) Order Form   July 31, 2010 MRN: [id number]   [facility] 04-13-1966   Physicican Name:Dr.Lowne  Diagnosis Code:V76.51      Almeta Monas CMA (AAMA)

## 2012-04-12 ENCOUNTER — Ambulatory Visit (INDEPENDENT_AMBULATORY_CARE_PROVIDER_SITE_OTHER): Payer: Self-pay | Admitting: Family Medicine

## 2012-04-12 ENCOUNTER — Encounter: Payer: Self-pay | Admitting: Family Medicine

## 2012-04-12 VITALS — BP 98/62 | HR 72 | Temp 98.6°F | Wt 109.8 lb

## 2012-04-12 DIAGNOSIS — J329 Chronic sinusitis, unspecified: Secondary | ICD-10-CM

## 2012-04-12 MED ORDER — CEFUROXIME AXETIL 500 MG PO TABS: 500.0000 mg | ORAL_TABLET | Freq: Two times a day (BID) | ORAL | Status: AC

## 2012-04-12 NOTE — Patient Instructions (Signed)

## 2012-04-12 NOTE — Progress Notes (Signed)
  Subjective:     Sarah Hale is a 46 y.o. female who presents for evaluation of symptoms of a URI. Symptoms include congestion, facial pain, headache described as frontal, nasal congestion and sinus pressure. Onset of symptoms was 1 week ago, and has been gradually worsening since that time. Treatment to date: antihistamines and cough suppressants.  The following portions of the patient's history were reviewed and updated as appropriate: allergies, current medications, past family history, past medical history, past social history, past surgical history and problem list.  Review of Systems Pertinent items are noted in HPI.   Objective:    BP 98/62  Pulse 72  Temp(Src) 98.6 F (37 C) (Oral)  Wt 109 lb 12.8 oz (49.805 kg)  SpO2 98% General appearance: alert, cooperative, appears stated age and no distress Ears: normal TM's and external ear canals both ears Nose: green discharge, moderate congestion, sinus tenderness bilateral Throat: lips, mucosa, and tongue normal; teeth and gums normal Neck: mild anterior cervical adenopathy, supple, symmetrical, trachea midline and thyroid not enlarged, symmetric, no tenderness/mass/nodules Lungs: clear to auscultation bilaterally Heart: regular rate and rhythm, S1, S2 normal, no murmur, click, rub or gallop Lymph nodes: Cervical, supraclavicular, and axillary nodes normal.   Assessment:    sinusitis   Plan:    Discussed the diagnosis and treatment of sinusitis. Suggested symptomatic OTC remedies. Nasal saline spray for congestion. Ceftin per orders. Nasal steroids per orders. Follow up as needed.

## 2014-02-07 ENCOUNTER — Telehealth: Payer: Self-pay

## 2014-02-07 NOTE — Telephone Encounter (Signed)
Left message for call back Non-identifiable   Pap- 07/02/08 MMG- Flu- Td- 05/22/04

## 2014-02-07 NOTE — Telephone Encounter (Signed)
Medication and allergies:  Reviewed and updated  90 day supply/mail order: n/a [facility] pharmacy[facility]  [id number] [place],  - [address]   Immunizations due:  Influenza-declined   A/P: No changes to personal, family history or past surgical hx PAP- 07/02/08- negative [facility]- "long time ago" Flu- declined Tdap- 05/22/04   To Discuss with Provider: Infection on ring finger nail on right hand and index finger nail on left hand.

## 2014-02-08 ENCOUNTER — Encounter: Payer: Self-pay | Admitting: Family Medicine

## 2014-02-08 ENCOUNTER — Other Ambulatory Visit (HOSPITAL_COMMUNITY)
Admission: RE | Admit: 2014-02-08 | Discharge: 2014-02-08 | Disposition: A | Discharge status: Home / Self Care | Payer: No Typology Code available for payment source | Source: Ambulatory Visit | Attending: Family Medicine | Admitting: Family Medicine

## 2014-02-08 ENCOUNTER — Encounter: Payer: Self-pay | Admitting: Gastroenterology

## 2014-02-08 ENCOUNTER — Ambulatory Visit (INDEPENDENT_AMBULATORY_CARE_PROVIDER_SITE_OTHER): Payer: No Typology Code available for payment source | Admitting: Family Medicine

## 2014-02-08 VITALS — BP 100/60 | HR 64 | Temp 98.6°F | Ht 59.0 in | Wt 110.0 lb

## 2014-02-08 DIAGNOSIS — Z01419 Encounter for gynecological examination (general) (routine) without abnormal findings: Secondary | ICD-10-CM | POA: Insufficient documentation

## 2014-02-08 DIAGNOSIS — Z0000 Encounter for general adult medical examination without abnormal findings: Secondary | ICD-10-CM

## 2014-02-08 DIAGNOSIS — B351 Tinea unguium: Secondary | ICD-10-CM

## 2014-02-08 DIAGNOSIS — Z1239 Encounter for other screening for malignant neoplasm of breast: Secondary | ICD-10-CM

## 2014-02-08 DIAGNOSIS — R195 Other fecal abnormalities: Secondary | ICD-10-CM

## 2014-02-08 MED ORDER — CEPHALEXIN 500 MG PO CAPS: 500.0000 mg | ORAL_CAPSULE | Freq: Two times a day (BID) | ORAL | Status: DC

## 2014-02-08 MED ORDER — CICLOPIROX 8 % EX SOLN: Freq: Every day | CUTANEOUS | Status: DC

## 2014-02-08 NOTE — Progress Notes (Signed)
Subjective:     Sarah Hale is a 48 y.o. female and is here for a comprehensive physical exam. The patient reports problems - swelling L index and R middle finger and tenderness.  History   Social History  . Marital Status: Married    Spouse Name: N/A    Number of Children: N/A  . Years of Education: N/A   Occupational History  . Not on file.   Social History Main Topics  . Smoking status: Never Smoker   . Smokeless tobacco: Never Used  . Alcohol Use: No  . Drug Use: No  . Sexual Activity: Yes    Birth Control/ Protection: None   Other Topics Concern  . Not on file   Social History Narrative  . No narrative on file   Health Maintenance  Topic Date Due  . Mammogram  06/11/1985  . Pap Smear  06/11/1985  . Influenza Vaccine  10/10/2014 (Originally 07/07/2013)  . Tetanus/tdap  05/22/2014    The following portions of the patient's history were reviewed and updated as appropriate:  She  has no past medical history on file. She  does not have any pertinent problems on file. She  has no past surgical history on file. Her family history includes Liver cancer in her brother and father. She  reports that she has never smoked. She has never used smokeless tobacco. She reports that she does not drink alcohol or use illicit drugs. She has a current medication list which includes the following prescription(s): fish oil, multivitamin, cephalexin, and ciclopirox. Current Outpatient Prescriptions on File Prior to Visit  Medication Sig Dispense Refill  . Fish Oil OIL Take 1,400 mg by mouth daily.      . Multiple Vitamin (MULTIVITAMIN) tablet Take 1 tablet by mouth daily.       No current facility-administered medications on file prior to visit.   She has No Known Allergies..  Review of Systems Review of Systems  Constitutional: Negative for activity change, appetite change and fatigue.  HENT: Negative for hearing loss, congestion, tinnitus and ear discharge.  dentist  [id number] Eyes: Negative for visual disturbance (see optho q1y -- vision corrected to 20/20 with glasses).  Respiratory: Negative for cough, chest tightness and shortness of breath.   Cardiovascular: Negative for chest pain, palpitations and leg swelling.  Gastrointestinal: Negative for abdominal pain, diarrhea, constipation and abdominal distention.  Genitourinary: Negative for urgency, frequency, decreased urine volume and difficulty urinating.  Musculoskeletal: Negative for back pain, arthralgias and gait problem.  Skin: Negative for color change, pallor and rash.  Neurological: Negative for dizziness, light-headedness, numbness and headaches.  Hematological: Negative for adenopathy. Does not bruise/bleed easily.  Psychiatric/Behavioral: Negative for suicidal ideas, confusion, sleep disturbance, self-injury, dysphoric mood, decreased concentration and agitation.       Objective:    BP 100/60  Pulse 64  Temp(Src) 98.6 F (37 C) (Oral)  Ht 4\' 11"  (1.499 m)  Wt 110 lb (49.896 kg)  BMI 22.21 kg/m2  SpO2 97%  LMP 01/29/2014 General appearance: alert, cooperative, appears stated age and no distress Head: Normocephalic, without obvious abnormality, atraumatic Eyes: conjunctivae/corneas clear. PERRL, EOM&#39;s intact. Fundi benign. Ears: L tm + hole Nose: Nares normal. Septum midline. Mucosa normal. No drainage or sinus tenderness. Throat: lips, mucosa, and tongue normal; teeth and gums normal Neck: no adenopathy, supple, symmetrical, trachea midline and thyroid not enlarged, symmetric, no tenderness/mass/nodules Back: symmetric, no curvature. ROM normal. No CVA tenderness. Lungs: clear to auscultation bilaterally Breasts: normal appearance, no  masses or tenderness Heart: S1, S2 normal Abdomen: soft, non-tender; bowel sounds normal; no masses,  no organomegaly Pelvic: cervix normal in appearance, external genitalia normal, no adnexal masses or tenderness, no cervical motion tenderness,  rectovaginal septum normal, uterus normal size, shape, and consistency, vagina normal without discharge and pap done Extremities: extremities normal, atraumatic, no cyanosis or edema-- + fungus , R index and l middle finger with swelling and errythema Pulses: 2+ and symmetric Skin: Skin color, texture, turgor normal. No rashes or lesions Lymph nodes: Cervical, supraclavicular, and axillary nodes normal. Neurologic: Alert and oriented X 3, normal strength and tone. Normal symmetric reflexes. Normal coordination and gait Psych--no depression, no anxiety      Assessment:    Healthy female exam.      Plan:    .ghm utd Check labs See After Visit Summary for Counseling Recommendations

## 2014-02-08 NOTE — Assessment & Plan Note (Signed)
See meds and orders 

## 2014-02-08 NOTE — Patient Instructions (Signed)

## 2014-03-21 ENCOUNTER — Encounter: Payer: Self-pay | Admitting: Gastroenterology

## 2014-03-21 ENCOUNTER — Ambulatory Visit (INDEPENDENT_AMBULATORY_CARE_PROVIDER_SITE_OTHER): Payer: No Typology Code available for payment source | Admitting: Gastroenterology

## 2014-03-21 VITALS — BP 98/62 | HR 64 | Ht 59.0 in | Wt 109.0 lb

## 2014-03-21 DIAGNOSIS — R195 Other fecal abnormalities: Secondary | ICD-10-CM

## 2014-03-21 DIAGNOSIS — R1013 Epigastric pain: Secondary | ICD-10-CM | POA: Insufficient documentation

## 2014-03-21 DIAGNOSIS — K648 Other hemorrhoids: Secondary | ICD-10-CM | POA: Insufficient documentation

## 2014-03-21 MED ORDER — NA SULFATE-K SULFATE-MG SULF 17.5-3.13-1.6 GM/177ML PO SOLN: 1.0000 | Freq: Once | ORAL | Status: DC

## 2014-03-21 MED ORDER — RANITIDINE HCL 150 MG PO TABS: 150.0000 mg | ORAL_TABLET | Freq: Two times a day (BID) | ORAL | Status: DC

## 2014-03-21 NOTE — Progress Notes (Signed)
    _                                                                                                                History of Present Illness: 48 year old Asian female referred for evaluation of abdominal pain and Hemoccult-positive stool.  For the past year she's had intermittent low-grade postprandial upper abdominal discomfort.  Pain is without radiation.  She denies nausea or pyrosis.  She is on no gastric irritants including nonsteroidals.  She has not taken any medication for this.  Routine Hemoccult testing apparently was positive.  Patient suffers from mild constipation.  She has occasional prolapse of a hemorrhoid.  She has intermittent rectal discomfort.  She denies rectal bleeding.    History reviewed. No pertinent past medical history. History reviewed. No pertinent past surgical history. family history includes Liver cancer in her brother and father. Current Outpatient Prescriptions  Medication Sig Dispense Refill  . ciclopirox (PENLAC) 8 % solution Apply topically at bedtime. Apply over nail and surrounding skin. . After seven (7) days, may remove with alcohol and continue cycle.  6.6 mL  0  . Fish Oil OIL Take 1,400 mg by mouth daily.      . Multiple Vitamin (MULTIVITAMIN) tablet Take 1 tablet by mouth daily.       No current facility-administered medications for this visit.   Allergies as of 03/21/2014  . (No Known Allergies)    reports that she has never smoked. She has never used smokeless tobacco. She reports that she does not drink alcohol or use illicit drugs.     Review of Systems: Pertinent positive and negative review of systems were noted in the above HPI section. All other review of systems were otherwise negative.  Vital signs were reviewed in today's medical record Physical Exam: General: Well developed , well nourished, no acute distress Skin: anicteric Head: Normocephalic and atraumatic Eyes:  sclerae anicteric, EOMI Ears: Normal  auditory acuity Mouth: No deformity or lesions Neck: Supple, no masses or thyromegaly Lungs: Clear throughout to auscultation Heart: Regular rate and rhythm; no murmurs, rubs or bruits Abdomen: Soft, non tender and non distended. No masses, hepatosplenomegaly or hernias noted. Normal Bowel sounds Rectal: A small external skin tag is present Musculoskeletal: Symmetrical with no gross deformities  Skin: No lesions on visible extremities Pulses:  Normal pulses noted Extremities: No clubbing, cyanosis, edema or deformities noted Neurological: Alert oriented x 4, grossly nonfocal Cervical Nodes:  No significant cervical adenopathy Inguinal Nodes: No significant inguinal adenopathy Psychological:  Alert and cooperative. Normal mood and affect  See Assessment and Plan under Problem List

## 2014-03-21 NOTE — Assessment & Plan Note (Signed)
One year history of intermittent low-grade postprandial upper abdominal pain.  This is probably due to ulcer or nonulcer dyspepsia.  Recommendations #1 trial of ranitidine 150 mg twice a day.  If symptoms are not improved will proceed with upper endoscopy and check for H. pyloric

## 2014-03-21 NOTE — Assessment & Plan Note (Signed)
Patient has symptomatic grade 2-3 hemorrhoids.  Would consider band ligation once GI workup is complete

## 2014-03-21 NOTE — Patient Instructions (Signed)
You have been scheduled for a colonoscopy with propofol. Please follow written instructions given to you at your visit today.  Please pick up your prep kit at the pharmacy within the next 1-3 days. If you use inhalers (even only as needed), please bring them with you on the day of your procedure. Your physician has requested that you go to www.startemmi.com and enter the access code given to you at your visit today. This web site gives a general overview about your procedure. However, you should still follow specific instructions given to you by our office regarding your preparation for the procedure.  Your 1st Hemorrhoidal banding is scheduled on 05/09/2014 at 9:15am

## 2014-03-21 NOTE — Assessment & Plan Note (Signed)
GI bleeding sources including polyps, hemorrhoids, AVMs or neoplasm should be ruled out.  Recommendations #1 colonoscopy; if negative I will obtain followup Hemoccults

## 2014-03-27 ENCOUNTER — Ambulatory Visit
Admission: RE | Admit: 2014-03-27 | Discharge: 2014-03-27 | Disposition: A | Discharge status: Home / Self Care | Payer: No Typology Code available for payment source | Source: Ambulatory Visit | Attending: Family Medicine | Admitting: Family Medicine

## 2014-03-27 DIAGNOSIS — Z1239 Encounter for other screening for malignant neoplasm of breast: Secondary | ICD-10-CM

## 2014-03-29 ENCOUNTER — Other Ambulatory Visit: Payer: Self-pay | Admitting: Family Medicine

## 2014-03-29 DIAGNOSIS — R928 Other abnormal and inconclusive findings on diagnostic imaging of breast: Secondary | ICD-10-CM

## 2014-04-10 ENCOUNTER — Encounter: Payer: Self-pay | Admitting: Gastroenterology

## 2014-04-10 ENCOUNTER — Ambulatory Visit (AMBULATORY_SURGERY_CENTER): Payer: No Typology Code available for payment source | Admitting: Gastroenterology

## 2014-04-10 VITALS — BP 103/54 | HR 56 | Temp 98.4°F | Resp 16 | Ht 59.0 in | Wt 109.0 lb

## 2014-04-10 DIAGNOSIS — R195 Other fecal abnormalities: Secondary | ICD-10-CM

## 2014-04-10 DIAGNOSIS — K648 Other hemorrhoids: Secondary | ICD-10-CM

## 2014-04-10 DIAGNOSIS — R1013 Epigastric pain: Secondary | ICD-10-CM

## 2014-04-10 MED ORDER — SODIUM CHLORIDE 0.9 % IV SOLN: 500.0000 mL | INTRAVENOUS | Status: DC

## 2014-04-10 NOTE — Op Note (Signed)
[facility] [address] [place], [id number]   COLONOSCOPY PROCEDURE REPORT  PATIENT: Sarah Hale, Sarah Hale  MR#: [id number] BIRTHDATE: 1966-04-04 , 47  yrs. old GENDER: Female ENDOSCOPIST: Louis Meckelobert D Aayla Marrocco, MD REFERRED XB:JYNWGNBY:Yvonne Lowne, DO PROCEDURE DATE:  04/10/2014 PROCEDURE:   Colonoscopy, diagnostic First Screening Colonoscopy - Avg.  risk and is 50 yrs.  old or older Yes.  Prior Negative Screening - Now for repeat screening. N/A  History of Adenoma - Now for follow-up colonoscopy & has been > or = to 3 yrs.  N/A  Polyps Removed Today? No.  Recommend repeat exam, <10 yrs? No. ASA CLASS:   Class II INDICATIONS:heme-positive stool. MEDICATIONS: MAC sedation, administered by CRNA and propofol (Diprivan) 200mg  IV  DESCRIPTION OF PROCEDURE:   After the risks benefits and alternatives of the procedure were thoroughly explained, informed consent was obtained.  A digital rectal exam revealed no abnormalities of the rectum.   The LB [id number]  endoscope was introduced through the anus and advanced to the terminal ileum which was intubated for a short distance. No adverse events experienced.   The quality of the prep was excellent using Suprep The instrument was then slowly withdrawn as the colon was fully examined.      COLON FINDINGS: Internal hemorrhoids were found.   A normal appearing cecum, ileocecal valve, and appendiceal orifice were identified.  The ascending, hepatic flexure, transverse, splenic flexure, descending, sigmoid colon and rectum appeared unremarkable.  No polyps or cancers were seen.   The mucosa appeared normal in the terminal ileum.  Retroflexed views revealed no abnormalities. The time to cecum=3 minutes 32 seconds. Withdrawal time=7 minutes 11 seconds.  The scope was withdrawn and the procedure completed. COMPLICATIONS: There were no complications.  ENDOSCOPIC IMPRESSION: 1.   Internal hemorrhoids 2.   Normal colon 3.    Normal mucosa in the terminal ileum  Hemoccult-positive stool could be due to hemorrhoidal disease  RECOMMENDATIONS: 1.  followup Hemoccults in 7-10 days 2.  band ligation of symptomatic grade 2 hemorrhoids 3.  followup colonoscopy 10 years  eSigned:  Louis Meckelobert D Roxana Lai, MD 04/10/2014 4:32 PM   cc:   PATIENT NAME:  Sarah Hale, Sarah Hale MR#: [id number]

## 2014-04-10 NOTE — Progress Notes (Signed)
  [facility] Anesthesia Post-op Note  Patient: Sarah Hale  Procedure(s) Performed: colonoscopy  Patient Location: [facility] - Recovery Area  Anesthesia Type: Deep Sedation/Propofol  Level of Consciousness: awake, oriented and patient cooperative  Airway and Oxygen Therapy: Patient Spontanous Breathing  Post-op Pain: none  Post-op Assessment:  Post-op Vital signs reviewed, Patient's Cardiovascular Status Stable, Respiratory Function Stable, Patent Airway, No signs of Nausea or vomiting and Pain level controlled  Post-op Vital Signs: Reviewed and stable  Complications: No apparent anesthesia complications  Vista LawmanBeth E Dim Meisinger 4:33 PM

## 2014-04-10 NOTE — Patient Instructions (Signed)
Discharge instructions given with verbal understanding. Handouts on hemorrhoids,high fiber diet. Hemoccult cards given upon discharge. Office will schedule banding of hemorrhoids. Resume previous medications. YOU HAD AN ENDOSCOPIC PROCEDURE TODAY AT THE [facility]: Refer to the procedure report that was given to you for any specific questions about what was found during the examination.  If the procedure report does not answer your questions, please call your gastroenterologist to clarify.  If you requested that your care partner not be given the details of your procedure findings, then the procedure report has been included in a sealed envelope for you to review at your convenience later.  YOU SHOULD EXPECT: Some feelings of bloating in the abdomen. Passage of more gas than usual.  Walking can help get rid of the air that was put into your GI tract during the procedure and reduce the bloating. If you had a lower endoscopy (such as a colonoscopy or flexible sigmoidoscopy) you may notice spotting of blood in your stool or on the toilet paper. If you underwent a bowel prep for your procedure, then you may not have a normal bowel movement for a few days.  DIET: Your first meal following the procedure should be a light meal and then it is ok to progress to your normal diet.  A half-sandwich or bowl of soup is an example of a good first meal.  Heavy or fried foods are harder to digest and may make you feel nauseous or bloated.  Likewise meals heavy in dairy and vegetables can cause extra gas to form and this can also increase the bloating.  Drink plenty of fluids but you should avoid alcoholic beverages for 24 hours.  ACTIVITY: Your care partner should take you home directly after the procedure.  You should plan to take it easy, moving slowly for the rest of the day.  You can resume normal activity the day after the procedure however you should NOT DRIVE or use heavy machinery for 24 hours  (because of the sedation medicines used during the test).    SYMPTOMS TO REPORT IMMEDIATELY: A gastroenterologist can be reached at any hour.  During normal business hours, 8:30 AM to 5:00 PM Monday through Friday, call [phone].  After hours and on weekends, please call the GI answering service at [phone] who will take a message and have the physician on call contact you.   Following lower endoscopy (colonoscopy or flexible sigmoidoscopy):  Excessive amounts of blood in the stool  Significant tenderness or worsening of abdominal pains  Swelling of the abdomen that is new, acute  Fever of 100F or higher  FOLLOW UP: If any biopsies were taken you will be contacted by phone or by letter within the next 1-3 weeks.  Call your gastroenterologist if you have not heard about the biopsies in 3 weeks.  Our staff will call the home number listed on your records the next business day following your procedure to check on you and address any questions or concerns that you may have at that time regarding the information given to you following your procedure. This is a courtesy call and so if there is no answer at the home number and we have not heard from you through the emergency physician on call, we will assume that you have returned to your regular daily activities without incident.  SIGNATURES/CONFIDENTIALITY: You and/or your care partner have signed paperwork which will be entered into your electronic medical record.  These signatures attest to the  fact that that the information above on your After Visit Summary has been reviewed and is understood.  Full responsibility of the confidentiality of this discharge information lies with you and/or your care-partner.

## 2014-04-11 ENCOUNTER — Telehealth: Payer: Self-pay | Admitting: *Deleted

## 2014-04-11 NOTE — Telephone Encounter (Signed)
No answer, left message to call if questions or concerns. 

## 2014-04-12 ENCOUNTER — Ambulatory Visit
Admission: RE | Admit: 2014-04-12 | Discharge: 2014-04-12 | Disposition: A | Discharge status: Home / Self Care | Payer: No Typology Code available for payment source | Source: Ambulatory Visit | Attending: Family Medicine | Admitting: Family Medicine

## 2014-04-12 ENCOUNTER — Encounter (INDEPENDENT_AMBULATORY_CARE_PROVIDER_SITE_OTHER): Payer: Self-pay

## 2014-04-12 DIAGNOSIS — R928 Other abnormal and inconclusive findings on diagnostic imaging of breast: Secondary | ICD-10-CM

## 2014-05-01 ENCOUNTER — Other Ambulatory Visit (INDEPENDENT_AMBULATORY_CARE_PROVIDER_SITE_OTHER): Payer: No Typology Code available for payment source

## 2014-05-01 ENCOUNTER — Other Ambulatory Visit: Payer: Self-pay

## 2014-05-01 DIAGNOSIS — R195 Other fecal abnormalities: Secondary | ICD-10-CM

## 2014-05-01 LAB — HEMOCCULT SLIDES (X 3 CARDS)
Fecal Occult Blood: NEGATIVE
OCCULT 1: NEGATIVE
OCCULT 2: NEGATIVE
OCCULT 3: NEGATIVE
OCCULT 4: NEGATIVE
OCCULT 5: NEGATIVE

## 2014-05-02 NOTE — Progress Notes (Signed)
Quick Note:  Please inform the patient that hemeoccults were negative. No further GI w/u ______ 

## 2014-05-09 ENCOUNTER — Ambulatory Visit (INDEPENDENT_AMBULATORY_CARE_PROVIDER_SITE_OTHER): Payer: No Typology Code available for payment source | Admitting: Gastroenterology

## 2014-05-09 ENCOUNTER — Encounter: Payer: Self-pay | Admitting: Gastroenterology

## 2014-05-09 VITALS — BP 90/60 | HR 68 | Ht 59.0 in | Wt 107.2 lb

## 2014-05-09 DIAGNOSIS — K648 Other hemorrhoids: Secondary | ICD-10-CM

## 2014-05-09 NOTE — Progress Notes (Signed)
PROCEDURE NOTE: The patient presents with symptomatic grade *2**  hemorrhoids, requesting rubber band ligation of his/her hemorrhoidal disease.  All risks, benefits and alternative forms of therapy were described and informed consent was obtained.   The anorectum was pre-medicated with lubricant and nitroglycerine ointment The decision was made to band the *left lateral** internal hemorrhoid, and the [facility] O'Regan System was used to perform band ligation without complication.  Digital anorectal examination was then performed to assure proper positioning of the band, and to adjust the banded tissue as required.  The patient was discharged home without pain or other issues.  Dietary and behavioral recommendations were given and along with follow-up instructions.    The patient will return in *2** for  follow-up and possible additional banding as required. No complications were encountered and the patient tolerated the procedure well.   

## 2014-05-09 NOTE — Patient Instructions (Signed)
HEMORRHOID BANDING PROCEDURE    FOLLOW-UP CARE   1. The procedure you have had should have been relatively painless since the banding of the area involved does not have nerve endings and there is no pain sensation.  The rubber band cuts off the blood supply to the hemorrhoid and the band may fall off as soon as 48 hours after the banding (the band may occasionally be seen in the toilet bowl following a bowel movement). You may notice a temporary feeling of fullness in the rectum which should respond adequately to plain Tylenol or Motrin.  2. Following the banding, avoid strenuous exercise that evening and resume full activity the next day.  A sitz bath (soaking in a warm tub) or bidet is soothing, and can be useful for cleansing the area after bowel movements.     3. To avoid constipation, take two tablespoons of natural wheat bran, natural oat bran, flax, Benefiber or any over the counter fiber supplement and increase your water intake to 7-8 glasses daily.    4. Unless you have been prescribed anorectal medication, do not put anything inside your rectum for two weeks: No suppositories, enemas, fingers, etc.  5. Occasionally, you may have more bleeding than usual after the banding procedure.  This is often from the untreated hemorrhoids rather than the treated one.  Don't be concerned if there is a tablespoon or so of blood.  If there is more blood than this, lie flat with your bottom higher than your head and apply an ice pack to the area. If the bleeding does not stop within a half an hour or if you feel faint, call our office at [phone] or go to the emergency room.  6. Problems are not common; however, if there is a substantial amount of bleeding, severe pain, chills, fever or difficulty passing urine (very rare) or other problems, you should call us at [phone] or report to the nearest emergency room.  7. Do not stay seated continuously for more than 2-3 hours for a day or two  after the procedure.  Tighten your buttock muscles 10-15 times every two hours and take 10-15 deep breaths every 1-2 hours.  Do not spend more than a few minutes on the toilet if you cannot empty your bowel; instead re-visit the toilet at a later time.   Your 2nd banding is scheduled on 07/06/2014 at 10:15am

## 2014-07-06 ENCOUNTER — Encounter: Payer: No Typology Code available for payment source | Admitting: Gastroenterology

## 2014-10-29 ENCOUNTER — Encounter: Payer: Self-pay | Admitting: Medical

## 2014-10-29 ENCOUNTER — Ambulatory Visit: Payer: No Typology Code available for payment source | Admitting: Physician Assistant

## 2014-10-29 ENCOUNTER — Ambulatory Visit (INDEPENDENT_AMBULATORY_CARE_PROVIDER_SITE_OTHER): Payer: No Typology Code available for payment source | Admitting: Medical

## 2014-10-29 VITALS — BP 114/73 | HR 61 | Temp 98.2°F | Ht 59.0 in | Wt 109.8 lb

## 2014-10-29 DIAGNOSIS — B351 Tinea unguium: Secondary | ICD-10-CM

## 2014-10-29 DIAGNOSIS — Z5181 Encounter for therapeutic drug level monitoring: Secondary | ICD-10-CM

## 2014-10-29 LAB — COMPREHENSIVE METABOLIC PANEL
ALBUMIN: 4 g/dL (ref 3.5–5.2)
ALT: 12 U/L (ref 0–35)
AST: 22 U/L (ref 0–37)
Alkaline Phosphatase: 61 U/L (ref 39–117)
BUN: 14 mg/dL (ref 6–23)
CALCIUM: 9.5 mg/dL (ref 8.4–10.5)
CHLORIDE: 102 meq/L (ref 96–112)
CO2: 30 meq/L (ref 19–32)
Creatinine, Ser: 0.7 mg/dL (ref 0.4–1.2)
GFR: 92.13 mL/min (ref >60.00)
Glucose, Bld: 80 mg/dL (ref 70–99)
POTASSIUM: 5.4 meq/L — AB (ref 3.5–5.1)
Sodium: 139 mEq/L (ref 135–145)
Total Bilirubin: 0.5 mg/dL (ref 0.2–1.2)
Total Protein: 6.9 g/dL (ref 6.0–8.3)

## 2014-10-29 MED ORDER — TERBINAFINE HCL 250 MG PO TABS: 250.0000 mg | ORAL_TABLET | Freq: Every day | ORAL | Status: DC

## 2014-10-29 NOTE — Progress Notes (Signed)
Pre visit review using our clinic review tool, if applicable. No additional management support is needed unless otherwise documented below in the visit note. 

## 2014-10-29 NOTE — Assessment & Plan Note (Signed)
Pt advised on fungal nail infection tx. Not always successful and fungus can come back even if successful initially. Will get cmp/check hepatic panel before medication  is started and then repeat panel after finishes lamisil.   Pt advised that can take a while for nail to show improvement. If doe not work at all then refer to dermatologist.  Also advised since still menstruating then use condoms if she does have sex.

## 2014-10-29 NOTE — Progress Notes (Signed)
   Subjective:    Patient ID: Sarah Hale, female    DOB: 10/13/1966, 48 y.o.   MRN: [id number]  HPI   Pt has some discolored nails to her hands for 2 years. Pt states feet are normal. Pt works in [employer]. Washes her hands alot. Her hand are always wet. Pt also had exposed to [place] in [place](describes visiting there) and hands often moist. Also some manicure in [place].  LMP- November 14th.  Past Medical History  Diagnosis Date  . Hemorrhoids     History   Social History  . Marital Status: Married    Spouse Name: N/A    Number of Children: 1  . Years of Education: N/A   Occupational History  . Waitress    Social History Main Topics  . Smoking status: Never Smoker   . Smokeless tobacco: Never Used  . Alcohol Use: No  . Drug Use: No  . Sexual Activity: Yes    Birth Control/ Protection: None   Other Topics Concern  . Not on file   Social History Narrative    No past surgical history on file.  Family History  Problem Relation Age of Onset  . Liver cancer Brother   . Liver cancer Father     No Known Allergies  Current Outpatient Prescriptions on File Prior to Visit  Medication Sig Dispense Refill  . ciclopirox (PENLAC) 8 % solution Apply topically at bedtime. Apply over nail and surrounding skin. . After seven (7) days, may remove with alcohol and continue cycle. 6.6 mL 0  . Fish Oil OIL Take 1,400 mg by mouth daily.    . Multiple Vitamin (MULTIVITAMIN) tablet Take 1 tablet by mouth daily.    . ranitidine (ZANTAC) 150 MG tablet Take 1 tablet (150 mg total) by mouth 2 (two) times daily. 60 tablet 2   No current facility-administered medications on file prior to visit.    BP 114/73 mmHg  Pulse 61  Temp(Src) 98.2 F (36.8 C) (Oral)  Ht 4\' 11"  (1.499 m)  Wt 109 lb 12.8 oz (49.805 kg)  BMI 22.17 kg/m2  SpO2 100%  LMP 10/20/2014       Review of Systems  Constitutional: Negative for fever, chills and fatigue.  Cardiovascular:  Negative for chest pain and palpitations.  Gastrointestinal: Negative for nausea, abdominal pain, diarrhea, constipation and abdominal distention.  Skin:       Discolored nails on her hands.  Hematological: Negative for adenopathy. Does not bruise/bleed easily.       Objective:   Physical Exam  Genral- no acute distress pleasant Lungs- CTA Heart- RRR Abdomen- soft, nontender, nondistended. No organomegaly hepatmegaly. Skin- normal skin tone. No jaundice Hand- 3rd and 4th digit rt hand yellow discolored nails.Mild thick appearance. 2 digit left hand yellow and discolored. Mild thick appearance.       Assessment & Plan:

## 2014-10-29 NOTE — Patient Instructions (Signed)
For your likely nail fungus on your hands, I want you to get cmp today and if your test is normal then start lamisil tabs.  Medication not always effective for toenail fungus. And if effective fungus can come back.  Follow up 6 weeks

## 2015-02-04 ENCOUNTER — Encounter: Payer: Self-pay | Admitting: Family Medicine

## 2015-02-04 ENCOUNTER — Ambulatory Visit (INDEPENDENT_AMBULATORY_CARE_PROVIDER_SITE_OTHER): Payer: 59 | Admitting: Family Medicine

## 2015-02-04 VITALS — BP 113/74 | HR 60 | Temp 99.2°F | Wt 114.8 lb

## 2015-02-04 DIAGNOSIS — B351 Tinea unguium: Secondary | ICD-10-CM

## 2015-02-04 NOTE — Progress Notes (Signed)
Subjective:    Patient ID: Sarah Hale, female    DOB: 12/11/1965, 49 y.o.   MRN: [id number]  HPI  Patient here c/o fungus to 3 fingers x 3 years.  Pt tried penlac and lamisil with no good results.    Past Medical History  Diagnosis Date  . Hemorrhoids     Review of Systems  Constitutional: Negative for activity change, appetite change, fatigue and unexpected weight change.  Respiratory: Negative for cough and shortness of breath.   Cardiovascular: Negative for chest pain and palpitations.  Psychiatric/Behavioral: Negative for behavioral problems and dysphoric mood. The patient is not nervous/anxious.        Objective:    Physical Exam  Constitutional: She is oriented to person, place, and time. She appears well-developed and well-nourished. No distress.  HENT:  Right Ear: External ear normal.  Left Ear: External ear normal.  Nose: Nose normal.  Mouth/Throat: Oropharynx is clear and moist.  Eyes: EOM are normal. Pupils are equal, round, and reactive to light.  Neck: Normal range of motion. Neck supple.  Cardiovascular: Normal rate, regular rhythm and normal heart sounds.   No murmur heard. Pulmonary/Chest: Effort normal and breath sounds normal. No respiratory distress. She has no wheezes. She has no rales. She exhibits no tenderness.  Neurological: She is alert and oriented to person, place, and time.  Skin:  3 fingers on both hands ----c/w tinea and cuticles are cracked and red  Psychiatric: She has a normal mood and affect. Her behavior is normal. Judgment and thought content normal.    BP 113/74 mmHg  Pulse 60  Temp(Src) 99.2 F (37.3 C) (Oral)  Wt 114 lb 12.8 oz (52.073 kg)  SpO2 99% Wt Readings from Last 3 Encounters:  02/04/15 114 lb 12.8 oz (52.073 kg)  10/29/14 109 lb 12.8 oz (49.805 kg)  05/09/14 107 lb 4 oz (48.648 kg)     Lab Results  Component Value Date   WBC 5.7 08/01/2010   HGB 12.8 08/01/2010   HCT 38.5 08/01/2010   PLT 262.0  08/01/2010   GLUCOSE 80 10/29/2014   CHOL 173 08/01/2010   TRIG 23.0 08/01/2010   HDL 58.10 08/01/2010   LDLCALC 110* 08/01/2010   ALT 12 10/29/2014   AST 22 10/29/2014   NA 139 10/29/2014   K 5.4* 10/29/2014   CL 102 10/29/2014   CREATININE 0.7 10/29/2014   BUN 14 10/29/2014   CO2 30 10/29/2014   TSH 2.04 08/01/2010   HGBA1C 4.3* 03/10/2007    Mm Digital Diagnostic Unilat R  04/12/2014   CLINICAL DATA:  Screening recall for a possible right breast mass.  EXAM: DIGITAL DIAGNOSTIC  RIGHT MAMMOGRAM  COMPARISON:  Screening mammogram from to 03/27/2014.  ACR Breast Density Category c: The breast tissue is heterogeneously dense, which may obscure small masses.  FINDINGS: The initially questioned possible mass in the right breast resolves on the additional spot compression CC and MLO views compatible with superimposed breast tissue. There is no mammographic evidence of malignancy in the right breast.  IMPRESSION: Initially questioned possible right breast mass resolves on the additional imaging compatible with superimposed breast tissue. There is no mammographic evidence of malignancy in the right breast.  RECOMMENDATION: Screening mammogram in one year.(Code:SM-B-01Y)  I have discussed the findings and recommendations with the patient. Results were also provided in writing at the conclusion of the visit. If applicable, a reminder letter will be sent to the patient regarding the next appointment.  BI-RADS CATEGORY  1: Negative.   Electronically Signed   By: Edwin Cap M.D.   On: 04/12/2014 07:41       Assessment & Plan:   Problem List Items Addressed This Visit    Onychomycosis - Primary   Relevant Orders   Ambulatory referral to Dermatology    lamisil and penlac with no relief con't neosporin Refer to derm  Loreen Freud, DO

## 2015-02-04 NOTE — Progress Notes (Signed)
Pre visit review using our clinic review tool, if applicable. No additional management support is needed unless otherwise documented below in the visit note. 

## 2015-02-04 NOTE — Patient Instructions (Signed)
Ringworm, Nail A fungal infection of the nail (tinea unguium/onychomycosis) is common. It is common as the visible part of the nail is composed of dead cells which have no blood supply to help prevent infection. It occurs because fungi are everywhere and will pick any opportunity to grow on any dead material. Because nails are very slow growing they require up to 2 years of treatment with anti-fungal medications. The entire nail back to the base is infected. This includes approximately  of the nail which you cannot see. If your caregiver has prescribed a medication by mouth, take it every day and as directed. No progress will be seen for at least 6 to 9 months. Do not be disappointed! Because fungi live on dead cells with little or no exposure to blood supply, medication delivery to the infection is slow; thus the cure is slow. It is also why you can observe no progress in the first 6 months. The nail becoming cured is the base of the nail, as it has the blood supply. Topical medication such as creams and ointments are usually not effective. Important in successful treatment of nail fungus is closely following the medication regimen that your doctor prescribes. Sometimes you and your caregiver may elect to speed up this process by surgical removal of all the nails. Even this may still require 6 to 9 months of additional oral medications. See your caregiver as directed. Remember there will be no visible improvement for at least 6 months. See your caregiver sooner if other signs of infection (redness and swelling) develop. Document Released: 11/20/2000 Document Revised: 02/15/2012 Document Reviewed: 01/29/2009 Exit[employer] Patient Information 2015 [employer]. This information is not intended to replace advice given to you by your health care provider. Make sure you discuss any questions you have with your health care provider.  

## 2015-02-12 ENCOUNTER — Encounter: Payer: No Typology Code available for payment source | Admitting: Family Medicine

## 2015-03-05 ENCOUNTER — Telehealth: Payer: Self-pay | Admitting: Family Medicine

## 2015-03-05 NOTE — Telephone Encounter (Signed)
Pre visit letter sent  °

## 2015-03-22 ENCOUNTER — Telehealth: Payer: Self-pay | Admitting: *Deleted

## 2015-03-22 NOTE — Telephone Encounter (Addendum)
Pre visit. Unable to reach pt . Will call later. Pt currently working will call back when off.

## 2015-03-25 ENCOUNTER — Encounter: Payer: 59 | Admitting: Family Medicine

## 2015-03-28 ENCOUNTER — Telehealth: Payer: Self-pay | Admitting: Family Medicine

## 2015-03-28 ENCOUNTER — Encounter: Payer: Self-pay | Admitting: Family Medicine

## 2015-03-28 NOTE — Telephone Encounter (Signed)
May be language barrier -- please call

## 2015-03-28 NOTE — Telephone Encounter (Signed)
Pt was no show for CPE on 03/25/15- letter sent- charge?

## 2017-10-04 ENCOUNTER — Telehealth: Payer: Self-pay | Admitting: Family Medicine

## 2017-10-04 ENCOUNTER — Ambulatory Visit (INDEPENDENT_AMBULATORY_CARE_PROVIDER_SITE_OTHER): Payer: BLUE CROSS/BLUE SHIELD | Admitting: Family Medicine

## 2017-10-04 ENCOUNTER — Encounter: Payer: Self-pay | Admitting: Family Medicine

## 2017-10-04 VITALS — BP 102/68 | HR 89 | Temp 98.2°F | Resp 18 | Wt 106.8 lb

## 2017-10-04 DIAGNOSIS — R079 Chest pain, unspecified: Secondary | ICD-10-CM

## 2017-10-04 NOTE — Telephone Encounter (Signed)
Patient Name: Sarah Hale DOB: 08/27/1966 Initial Comment Caller is having chest pain. Nurse Assessment Nurse: Penne Lashoulter, RN, [place] Date/Time (Eastern Time): 10/04/2017 9:53:24 AM Confirm and document reason for call. If symptomatic, describe symptoms. ---caller states she is having chest pain. she has been having a little chest pain for 2 wks. pain is located on the right side of her chest under the breast. pain is intermittent. pain is worse in the morning when she wakes up. pain 4/10. not currently experiencing the chest pain. pain feels dull, light pain. pt states the pain is not bad, feels light. Does the patient have any new or worsening symptoms? ---Yes Will a triage be completed? ---Yes Related visit to physician within the last 2 weeks? ---No Does the PT have any chronic conditions? (i.e. diabetes, asthma, etc.) ---No Is the patient pregnant or possibly pregnant? (Ask all females between the ages of 3512-55) ---No Is this a behavioral health or substance abuse call? ---No Guidelines Guideline Title Affirmed Question Affirmed Notes Chest Pain [1] Chest pain lasting <= 5 minutes AND [2] NO chest pain or cardiac symptoms now (Exceptions: pains lasting a few seconds) Final Disposition User See Physician within 24 Hours Coulter, RN, [place] Comments Appt scheduled for today at 2:45 with PCP Referrals REFERRED TO PCP OFFICE Caller Disagree/Comply Comply Caller Understands Yes PreDisposition Call Doctor

## 2017-10-04 NOTE — Patient Instructions (Signed)

## 2017-10-04 NOTE — Progress Notes (Signed)
Patient ID: Eunice BlaseSupaporn Ahuja, female    DOB: 11/06/1966  Age: 51 y.o. MRN: [id number]    Subjective:  Subjective  HPI Kelse Auman presents for r sided chest pain  About 3 weeks ago.  She woke up with chest pain that lasted about 1-2 min and no sob.  It did not happen again.  No other complaints.     Review of Systems  Constitutional: Negative for activity change, appetite change, fatigue and unexpected weight change.  Respiratory: Negative for cough, chest tightness, shortness of breath and wheezing.   Cardiovascular: Positive for chest pain and palpitations.  Psychiatric/Behavioral: Negative for behavioral problems and dysphoric mood. The patient is not nervous/anxious.     History Past Medical History:  Diagnosis Date  . Hemorrhoids     She has no past surgical history on file.   Her family history includes Liver cancer in her brother and father.She reports that she has never smoked. She has never used smokeless tobacco. She reports that she does not drink alcohol or use drugs.  Current Outpatient Prescriptions on File Prior to Visit  Medication Sig Dispense Refill  . ciclopirox (PENLAC) 8 % solution Apply topically at bedtime. Apply over nail and surrounding skin. . After seven (7) days, may remove with alcohol and continue cycle. (Patient not taking: Reported on 02/04/2015) 6.6 mL 0  . Fish Oil OIL Take 1,400 mg by mouth daily.    . Multiple Vitamin (MULTIVITAMIN) tablet Take 1 tablet by mouth daily.    Marland Kitchen. terbinafine (LAMISIL) 250 MG tablet Take 1 tablet (250 mg total) by mouth daily. (Patient not taking: Reported on 02/04/2015) 42 tablet 0   No current facility-administered medications on file prior to visit.      Objective:  Objective  Physical Exam  Constitutional: She is oriented to person, place, and time. She appears well-developed and well-nourished.  HENT:  Head: Normocephalic and atraumatic.  Eyes: Conjunctivae and EOM are normal.  Neck: Normal range of  motion. Neck supple. No JVD present. Carotid bruit is not present. No thyromegaly present.  Cardiovascular: Normal rate, regular rhythm and normal heart sounds.   No murmur heard. Pulmonary/Chest: Effort normal and breath sounds normal. No respiratory distress. She has no wheezes. She has no rales. She exhibits no tenderness.  Musculoskeletal: She exhibits no edema.  Neurological: She is alert and oriented to person, place, and time.  Psychiatric: She has a normal mood and affect.  Nursing note and vitals reviewed.  BP 102/68 (BP Location: Left Arm, Patient Position: Sitting, Cuff Size: Normal)   Pulse 89   Temp 98.2 F (36.8 C) (Oral)   Resp 18   Wt 106 lb 12.8 oz (48.4 kg)   SpO2 98%   BMI 21.57 kg/m  Wt Readings from Last 3 Encounters:  10/04/17 106 lb 12.8 oz (48.4 kg)  02/04/15 114 lb 12.8 oz (52.1 kg)  10/29/14 109 lb 12.8 oz (49.8 kg)     Lab Results  Component Value Date   WBC 5.7 08/01/2010   HGB 12.8 08/01/2010   HCT 38.5 08/01/2010   PLT 262.0 08/01/2010   GLUCOSE 80 10/29/2014   CHOL 173 08/01/2010   TRIG 23.0 08/01/2010   HDL 58.10 08/01/2010   LDLCALC 110 (H) 08/01/2010   ALT 12 10/29/2014   AST 22 10/29/2014   NA 139 10/29/2014   K 5.4 (H) 10/29/2014   CL 102 10/29/2014   CREATININE 0.7 10/29/2014   BUN 14 10/29/2014   CO2 30 10/29/2014  TSH 2.04 08/01/2010   HGBA1C 4.3 (L) 03/10/2007    Mm Digital Diagnostic Unilat R  Result Date: 04/12/2014 CLINICAL DATA:  Screening recall for a possible right breast mass. EXAM: DIGITAL DIAGNOSTIC  RIGHT MAMMOGRAM COMPARISON:  Screening mammogram from to 03/27/2014. ACR Breast Density Category c: The breast tissue is heterogeneously dense, which may obscure small masses. FINDINGS: The initially questioned possible mass in the right breast resolves on the additional spot compression CC and MLO views compatible with superimposed breast tissue. There is no mammographic evidence of malignancy in the right breast.  IMPRESSION: Initially questioned possible right breast mass resolves on the additional imaging compatible with superimposed breast tissue. There is no mammographic evidence of malignancy in the right breast. RECOMMENDATION: Screening mammogram in one year.(Code:SM-B-01Y) I have discussed the findings and recommendations with the patient. Results were also provided in writing at the conclusion of the visit. If applicable, a reminder letter will be sent to the patient regarding the next appointment. BI-RADS CATEGORY  1: Negative. Electronically Signed   By: Edwin Cap M.D.   On: 04/12/2014 07:41     Assessment & Plan:  Plan  I am having Ms. Prowse maintain her multivitamin, Fish Oil, ciclopirox, and terbinafine.  No orders of the defined types were placed in this encounter.   Problem List Items Addressed This Visit    None    Visit Diagnoses    Chest pain, unspecified type    -  Primary   Relevant Orders   EKG 12-Lead (Completed)   DG Chest 2 View    ? Ms pain-  It is now not bothering the pt  She will return if it comes back   Follow-up: Return if symptoms worsen or fail to improve.  Donato Schultz, DO

## 2017-10-06 ENCOUNTER — Telehealth: Payer: Self-pay

## 2017-10-06 NOTE — Telephone Encounter (Signed)
[facility] follow up call made to patient. Left message for return call. Advised to call back in reference to phone call made to [facility]. Reminded patient she had Chest XR ordered which she did not have done last night.

## 2017-10-07 NOTE — Telephone Encounter (Signed)
She did but they lost the call trying to transfer ANd I didn't get her when I called back. I will try again.

## 2017-10-07 NOTE — Telephone Encounter (Signed)
Did she call back?

## 2017-10-08 ENCOUNTER — Telehealth: Payer: Self-pay

## 2017-10-08 NOTE — Telephone Encounter (Signed)
Called patient did not get answer left message for return call.

## 2017-10-08 NOTE — Telephone Encounter (Signed)
Follow up call made to patient. Left message for return call regarding symptoms.

## 2017-12-31 ENCOUNTER — Encounter: Payer: Self-pay | Admitting: Family Medicine

## 2017-12-31 ENCOUNTER — Ambulatory Visit (INDEPENDENT_AMBULATORY_CARE_PROVIDER_SITE_OTHER): Payer: BLUE CROSS/BLUE SHIELD | Admitting: Family Medicine

## 2017-12-31 ENCOUNTER — Other Ambulatory Visit: Payer: Self-pay | Admitting: Family Medicine

## 2017-12-31 VITALS — BP 100/60 | HR 63 | Temp 98.4°F | Resp 16 | Ht 59.0 in | Wt 101.8 lb

## 2017-12-31 DIAGNOSIS — Z0000 Encounter for general adult medical examination without abnormal findings: Secondary | ICD-10-CM | POA: Diagnosis not present

## 2017-12-31 DIAGNOSIS — Z1231 Encounter for screening mammogram for malignant neoplasm of breast: Secondary | ICD-10-CM | POA: Diagnosis not present

## 2017-12-31 DIAGNOSIS — Z1239 Encounter for other screening for malignant neoplasm of breast: Secondary | ICD-10-CM

## 2017-12-31 LAB — POC URINALSYSI DIPSTICK (AUTOMATED)
BILIRUBIN UA: NEGATIVE
Blood, UA: NEGATIVE
GLUCOSE UA: NEGATIVE
KETONES UA: NEGATIVE
Leukocytes, UA: NEGATIVE
NITRITE UA: NEGATIVE
PH UA: 6 (ref 5.0–8.0)
Protein, UA: NEGATIVE
SPEC GRAV UA: 1.01 (ref 1.010–1.025)
Urobilinogen, UA: 0.2 E.U./dL

## 2017-12-31 NOTE — Patient Instructions (Addendum)
Preventive Care 40-64 Years, Female Preventive care refers to lifestyle choices and visits with your health care provider that can promote health and wellness. What does preventive care include?  A yearly physical exam. This is also called an annual well check.  Dental exams once or twice a year.  Routine eye exams. Ask your health care provider how often you should have your eyes checked.  Personal lifestyle choices, including: ? Daily care of your teeth and gums. ? Regular physical activity. ? Eating a healthy diet. ? Avoiding tobacco and drug use. ? Limiting alcohol use. ? Practicing safe sex. ? Taking low-dose aspirin daily starting at age 58. ? Taking vitamin and mineral supplements as recommended by your health care provider. What happens during an annual well check? The services and screenings done by your health care provider during your annual well check will depend on your age, overall health, lifestyle risk factors, and family history of disease. Counseling Your health care provider may ask you questions about your:  Alcohol use.  Tobacco use.  Drug use.  Emotional well-being.  Home and relationship well-being.  Sexual activity.  Eating habits.  Work and work Statistician.  Method of birth control.  Menstrual cycle.  Pregnancy history.  Screening You may have the following tests or measurements:  Height, weight, and BMI.  Blood pressure.  Lipid and cholesterol levels. These may be checked every 5 years, or more frequently if you are over 81 years old.  Skin check.  Lung cancer screening. You may have this screening every year starting at age 78 if you have a 30-pack-year history of smoking and currently smoke or have quit within the past 15 years.  Fecal occult blood test (FOBT) of the stool. You may have this test every year starting at age 65.  Flexible sigmoidoscopy or colonoscopy. You may have a sigmoidoscopy every 5 years or a colonoscopy  every 10 years starting at age 30.  Hepatitis C blood test.  Hepatitis B blood test.  Sexually transmitted disease (STD) testing.  Diabetes screening. This is done by checking your blood sugar (glucose) after you have not eaten for a while (fasting). You may have this done every 1-3 years.  Mammogram. This may be done every 1-2 years. Talk to your health care provider about when you should start having regular mammograms. This may depend on whether you have a family history of breast cancer.  BRCA-related cancer screening. This may be done if you have a family history of breast, ovarian, tubal, or peritoneal cancers.  Pelvic exam and Pap test. This may be done every 3 years starting at age 80. Starting at age 36, this may be done every 5 years if you have a Pap test in combination with an HPV test.  Bone density scan. This is done to screen for osteoporosis. You may have this scan if you are at high risk for osteoporosis.  Discuss your test results, treatment options, and if necessary, the need for more tests with your health care provider. Vaccines Your health care provider may recommend certain vaccines, such as:  Influenza vaccine. This is recommended every year.  Tetanus, diphtheria, and acellular pertussis (Tdap, Td) vaccine. You may need a Td booster every 10 years.  Varicella vaccine. You may need this if you have not been vaccinated.  Zoster vaccine. You may need this after age 5.  Measles, mumps, and rubella (MMR) vaccine. You may need at least one dose of MMR if you were born in  1957 or later. You may also need a second dose.  Pneumococcal 13-valent conjugate (PCV13) vaccine. You may need this if you have certain conditions and were not previously vaccinated.  Pneumococcal polysaccharide (PPSV23) vaccine. You may need one or two doses if you smoke cigarettes or if you have certain conditions.  Meningococcal vaccine. You may need this if you have certain  conditions.  Hepatitis A vaccine. You may need this if you have certain conditions or if you travel or work in places where you may be exposed to hepatitis A.  Hepatitis B vaccine. You may need this if you have certain conditions or if you travel or work in places where you may be exposed to hepatitis B.  Haemophilus influenzae type b (Hib) vaccine. You may need this if you have certain conditions.  Talk to your health care provider about which screenings and vaccines you need and how often you need them. This information is not intended to replace advice given to you by your health care provider. Make sure you discuss any questions you have with your health care provider. Document Released: 12/20/2015 Document Revised: 08/12/2016 Document Reviewed: 09/24/2015 Elsevier Interactive Patient Education  2018 [employer]  

## 2017-12-31 NOTE — Progress Notes (Signed)
Subjective:     Sarah Hale is a 52 y.o. female and is here for a comprehensive physical exam. The patient reports no problems.  Social History   Socioeconomic History  . Marital status: Married    Spouse name: Not on file  . Number of children: 1  . Years of education: Not on file  . Highest education level: Not on file  Social Needs  . Financial resource strain: Not on file  . Food insecurity - worry: Not on file  . Food insecurity - inability: Not on file  . Transportation needs - medical: Not on file  . Transportation needs - non-medical: Not on file  Occupational History  . Occupation: Retail buyer: Special educational needs teacher  Tobacco Use  . Smoking status: Never Smoker  . Smokeless tobacco: Never Used  Substance and Sexual Activity  . Alcohol use: No  . Drug use: No  . Sexual activity: Yes    Birth control/protection: None  Other Topics Concern  . Not on file  Social History Narrative   Exercise-walks dog daily   Health Maintenance  Topic Date Due  . MAMMOGRAM  04/13/2015  . PAP SMEAR  12/31/2018 (Originally 02/08/2017)  . HIV Screening  12/08/2023 (Originally 06/11/1982)  . TETANUS/TDAP  01/01/2024 (Originally 05/22/2014)  . INFLUENZA VACCINE  07/07/2024 (Originally 07/07/2017)  . COLONOSCOPY  04/10/2024    The following portions of the patient's history were reviewed and updated as appropriate:  She  has a past medical history of Hemorrhoids. She does not have any pertinent problems on file. She  has no past surgical history on file. Her family history includes Liver cancer in her brother and father. She  reports that  has never smoked. she has never used smokeless tobacco. She reports that she does not drink alcohol or use drugs. She currently has no medications in their medication list. No current outpatient medications on file prior to visit.   No current facility-administered medications on file prior to visit.    She has No Known Allergies..  Review  of Systems Review of Systems  Constitutional: Negative for activity change, appetite change and fatigue.  HENT: Negative for hearing loss, congestion, tinnitus and ear discharge.  dentist q41m Eyes: Negative for visual disturbance (see optho q1y -- vision corrected to 20/20 with glasses).  Respiratory: Negative for cough, chest tightness and shortness of breath.   Cardiovascular: Negative for chest pain, palpitations and leg swelling.  Gastrointestinal: Negative for abdominal pain, diarrhea, constipation and abdominal distention.  Genitourinary: Negative for urgency, frequency, decreased urine volume and difficulty urinating.  Musculoskeletal: Negative for back pain, arthralgias and gait problem.  Skin: Negative for color change, pallor and rash.  Neurological: Negative for dizziness, light-headedness, numbness and headaches.  Hematological: Negative for adenopathy. Does not bruise/bleed easily.  Psychiatric/Behavioral: Negative for suicidal ideas, confusion, sleep disturbance, self-injury, dysphoric mood, decreased concentration and agitation.       Objective:    BP 100/60 (BP Location: Left Arm, Cuff Size: Normal)   Pulse 63   Temp 98.4 F (36.9 C) (Oral)   Resp 16   Ht 4\' 11"  (1.499 m)   Wt 101 lb 12.8 oz (46.2 kg)   LMP 10/20/2014   SpO2 98%   BMI 20.56 kg/m  General appearance: alert, cooperative, appears stated age and no distress Head: Normocephalic, without obvious abnormality, atraumatic Eyes: conjunctivae/corneas clear. PERRL, EOM's intact. Fundi benign. Ears: normal TM's and external ear canals both ears Nose: Nares normal. Septum midline.  Mucosa normal. No drainage or sinus tenderness. Throat: lips, mucosa, and tongue normal; teeth and gums normal Neck: no adenopathy, no carotid bruit, no JVD, supple, symmetrical, trachea midline and thyroid not enlarged, symmetric, no tenderness/mass/nodules Back: symmetric, no curvature. ROM normal. No CVA tenderness. Lungs: clear  to auscultation bilaterally Breasts: normal appearance, no masses or tenderness Heart: regular rate and rhythm, S1, S2 normal, no murmur, click, rub or gallop Abdomen: soft, non-tender; bowel sounds normal; no masses,  no organomegaly Pelvic: deferred Extremities: extremities normal, atraumatic, no cyanosis or edema Pulses: 2+ and symmetric Skin: Skin color, texture, turgor normal. No rashes or lesions Lymph nodes: Cervical, supraclavicular, and axillary nodes normal. Neurologic: Alert and oriented X 3, normal strength and tone. Normal symmetric reflexes. Normal coordination and gait    Assessment:    Healthy female exam.      Plan:    ghm utd Check labs Pt refuses all vaccines  See After Visit Summary for Counseling Recommendations   She will schedule her mammogram

## 2018-02-14 ENCOUNTER — Ambulatory Visit (HOSPITAL_BASED_OUTPATIENT_CLINIC_OR_DEPARTMENT_OTHER)
Admission: RE | Admit: 2018-02-14 | Discharge: 2018-02-14 | Disposition: A | Discharge status: Home / Self Care | Payer: BLUE CROSS/BLUE SHIELD | Source: Ambulatory Visit | Attending: Family Medicine | Admitting: Family Medicine

## 2018-02-14 DIAGNOSIS — Z1231 Encounter for screening mammogram for malignant neoplasm of breast: Secondary | ICD-10-CM
